# Patient Record
Sex: Female | Born: 1950 | Race: White | Hispanic: No | Smoking: Never smoker
Health system: Southern US, Community
[De-identification: ages and names within clinical notes are randomized; demographics above are authoritative.]

## PROBLEM LIST (undated history)

## (undated) DIAGNOSIS — M1711 Unilateral primary osteoarthritis, right knee: Secondary | ICD-10-CM

## (undated) DIAGNOSIS — E119 Type 2 diabetes mellitus without complications: Secondary | ICD-10-CM

## (undated) HISTORY — PX: KNEE SURGERY: SHX244

## (undated) HISTORY — PX: EYE SURGERY: SHX253

## (undated) HISTORY — DX: Type 2 diabetes mellitus without complications: E11.9

## (undated) HISTORY — PX: DILATION AND CURETTAGE OF UTERUS: SHX78

## (undated) HISTORY — PX: TONSILLECTOMY: SUR1361

---

## 2014-10-12 ENCOUNTER — Encounter: Payer: Self-pay | Admitting: Podiatry

## 2014-10-12 ENCOUNTER — Ambulatory Visit (INDEPENDENT_AMBULATORY_CARE_PROVIDER_SITE_OTHER): Payer: BLUE CROSS/BLUE SHIELD | Admitting: Podiatry

## 2014-10-12 ENCOUNTER — Ambulatory Visit (INDEPENDENT_AMBULATORY_CARE_PROVIDER_SITE_OTHER): Payer: BLUE CROSS/BLUE SHIELD

## 2014-10-12 VITALS — BP 121/73 | HR 83 | Resp 12

## 2014-10-12 DIAGNOSIS — R52 Pain, unspecified: Secondary | ICD-10-CM

## 2014-10-12 DIAGNOSIS — L03031 Cellulitis of right toe: Secondary | ICD-10-CM

## 2014-10-12 DIAGNOSIS — L02611 Cutaneous abscess of right foot: Secondary | ICD-10-CM | POA: Diagnosis not present

## 2014-10-12 NOTE — Progress Notes (Signed)
   Subjective:    Patient ID: Ephriam JenkinsSandra Shuman, female    DOB: [birth year], 64 y.o.   MRN: [id number]  HPI PT STATED RT FOOT 3RD TOE IS SWOLLEN, HAVE REDNESS, AND SHARP PAIN. THE TOE IS GETTING A LITTLE BETTER BUT GET WORSE WHEN SITTING/STANDING. URGENT CARE PRESCRIBE ANTIBIOTIC RX CEPHALEXIN  FOR 2 WEEKS AND IT HELP SOME.   Review of Systems  HENT: Positive for trouble swallowing.   Cardiovascular: Positive for leg swelling.  All other systems reviewed and are negative.      Objective:   Physical Exam        Assessment & Plan:

## 2015-03-11 DIAGNOSIS — R52 Pain, unspecified: Secondary | ICD-10-CM

## 2016-07-01 ENCOUNTER — Encounter (HOSPITAL_BASED_OUTPATIENT_CLINIC_OR_DEPARTMENT_OTHER): Payer: Self-pay | Admitting: Emergency Medicine

## 2016-07-01 ENCOUNTER — Emergency Department (HOSPITAL_BASED_OUTPATIENT_CLINIC_OR_DEPARTMENT_OTHER)
Admission: EM | Admit: 2016-07-01 | Discharge: 2016-07-01 | Disposition: A | Discharge status: Home / Self Care | Payer: Medicare Other | Attending: Emergency Medicine | Admitting: Emergency Medicine

## 2016-07-01 DIAGNOSIS — L551 Sunburn of second degree: Secondary | ICD-10-CM | POA: Insufficient documentation

## 2016-07-01 DIAGNOSIS — E119 Type 2 diabetes mellitus without complications: Secondary | ICD-10-CM | POA: Insufficient documentation

## 2016-07-01 MED ORDER — HYDROCODONE-ACETAMINOPHEN 5-325 MG PO TABS: 1.0000 | ORAL_TABLET | Freq: Four times a day (QID) | ORAL | 0 refills | Status: DC | PRN

## 2016-07-01 MED ORDER — NAPROXEN 250 MG PO TABS
500.0000 mg | ORAL_TABLET | Freq: Once | ORAL | Status: AC
  Administered 2016-07-01: 500 mg via ORAL
  Filled 2016-07-01: qty 2

## 2016-07-01 MED ORDER — NAPROXEN 500 MG PO TABS: 500.0000 mg | ORAL_TABLET | Freq: Two times a day (BID) | ORAL | 0 refills | Status: DC

## 2016-07-01 NOTE — ED Triage Notes (Signed)
Sunburn on both feet after going to beach

## 2016-07-01 NOTE — Discharge Instructions (Signed)
You were seen today for sunburn.  Use antiinflammatory medications.  If and when your blisters break, use antibiotic ointment to prevent secondary infection.

## 2016-07-01 NOTE — ED Provider Notes (Signed)
[facility] Provider Note   CSN: [id number] Arrival date & time: 07/01/16  2241  By signing my name below, I, Bing Neighbors., attest that this documentation has been prepared under the direction and in the presence of Shon Baton, MD. Electronically signed: Bing Neighbors., ED Scribe. 07/02/16. 12:11 AM.   History   Chief Complaint Chief Complaint  Patient presents with  . Sunburn    HPI Michele Frost is a 66 y.o. female with hx of Type 2 diabetes mellitus who presents to the Emergency Department complaining of sunburn with onset x3 days. Pt states that she drove from a vacation in [place] today. She states that x3 days ago she suffered sunburn while sitting on the beach. She has burns to the bilateral lower extremities and chest. Pt has tried Aloe lotion and burn spray with no relief. She has taken hydrocodone with mild relief. Reports pain is 8 out of 10. She denies any other complaints.   The history is provided by the patient. No language interpreter was used.    Past Medical History:  Diagnosis Date  . Diabetes mellitus without complication (HCC)     There are no active problems to display for this patient.   Past Surgical History:  Procedure Laterality Date  . HERNIA REPAIR    . KNEE SURGERY Right     OB History    No data available       Home Medications    Prior to Admission medications   Medication Sig Start Date End Date Taking? Authorizing Provider  HYDROcodone-acetaminophen (NORCO/VICODIN) 5-325 MG tablet Take 1 tablet by mouth every 6 (six) hours as needed. 07/01/16   Shon Baton, MD  naproxen (NAPROSYN) 500 MG tablet Take 1 tablet (500 mg total) by mouth 2 (two) times daily. Limit use to 3-5 days 07/01/16   Shon Baton, MD  NOVOLOG FLEXPEN 100 UNIT/ML FlexPen CHECK GLUCOSE <100 USE 0 UN, <150 = 4 UN, <200 = 8 UN, <250 = 12 UN, <300 = 15 UN, >300 = 18 UN 09/15/14   Historical Provider, MD    Family  History History reviewed. No pertinent family history.  Social History Social History  Substance Use Topics  . Smoking status: Never Smoker  . Smokeless tobacco: Never Used  . Alcohol use No     Allergies   Patient has no known allergies.   Review of Systems Review of Systems  Constitutional: Negative for chills and fever.  Respiratory: Negative for shortness of breath.   Gastrointestinal: Negative for nausea and vomiting.  Skin:       Sun burn to bilateral lower extremities and chest.  All other systems reviewed and are negative.    Physical Exam Updated Vital Signs BP (!) 177/92   Pulse (!) 110   Temp 98.5 F (36.9 C) (Oral)   Resp 18   Ht 5' 6.5" (1.689 m)   Wt 220 lb (99.8 kg)   SpO2 100%   BMI 34.98 kg/m   Physical Exam  Constitutional: She is oriented to person, place, and time. She appears well-developed and well-nourished. No distress.  Overweight  HENT:  Head: Normocephalic and atraumatic.  Cardiovascular: Normal rate, regular rhythm and normal heart sounds.   Pulmonary/Chest: Effort normal and breath sounds normal. No respiratory distress. She has no wheezes.  Neurological: She is alert and oriented to person, place, and time.  Skin: Skin is warm and dry.  Well-demarcated sunburn noted over the dorsum  of the bilateral feet extending over the lateral aspects of the calf, multiple intact blisters noted over the feet and toes, warmth noted with blanching erythema and scattered petechiae  Psychiatric: She has a normal mood and affect.  Nursing note and vitals reviewed.    ED Treatments / Results   DIAGNOSTIC STUDIES: Oxygen Saturation is 100% on RA, normal by my interpretation.   COORDINATION OF CARE: 12:11 AM-Discussed next steps with pt. Pt verbalized understanding and is agreeable with the plan.    Labs (all labs ordered are listed, but only abnormal results are displayed) Labs Reviewed - No data to display  EKG  EKG  Interpretation None       Radiology No results found.  Procedures Procedures (including critical care time)  Medications Ordered in ED Medications  naproxen (NAPROSYN) tablet 500 mg (500 mg Oral Given 07/01/16 2346)     Initial Impression / Assessment and Plan / ED Course  I have reviewed the triage vital signs and the nursing notes.  Pertinent labs & imaging results that were available during my care of the patient were reviewed by me and considered in my medical decision making (see chart for details).     Patient presents with pain and sunburn to the bilateral feet and legs. She is nontoxic-appearing. She appears to at least have second-degree burns given blistering. However, she may also have some element of sun poisoning given scattered petechiae. Reports that she tolerates NSAIDs. I have reviewed her outside charts. Most recent creatinine 1.1. Recommend naproxen twice a day and hydrocodone as needed for breakthrough pain. Continue aloe as needed. If and when blisters break, and antibiotic ointment to prevent infection.  After history, exam, and medical workup I feel the patient has been appropriately medically screened and is safe for discharge home. Pertinent diagnoses were discussed with the patient. Patient was given return precautions.   Final Clinical Impressions(s) / ED Diagnoses   Final diagnoses:  Sunburn of second degree    New Prescriptions New Prescriptions   HYDROCODONE-ACETAMINOPHEN (NORCO/VICODIN) 5-325 MG TABLET    Take 1 tablet by mouth every 6 (six) hours as needed.   NAPROXEN (NAPROSYN) 500 MG TABLET    Take 1 tablet (500 mg total) by mouth 2 (two) times daily. Limit use to 3-5 days   I personally performed the services described in this documentation, which was scribed in my presence. The recorded information has been reviewed and is accurate.     Shon Baton, MD 07/02/16 [phone]

## 2017-08-04 ENCOUNTER — Other Ambulatory Visit: Payer: Self-pay | Admitting: Orthopedic Surgery

## 2017-09-08 ENCOUNTER — Encounter (HOSPITAL_COMMUNITY): Payer: Self-pay

## 2017-09-08 ENCOUNTER — Other Ambulatory Visit: Payer: Self-pay

## 2017-09-08 ENCOUNTER — Encounter (HOSPITAL_COMMUNITY)
Admission: RE | Admit: 2017-09-08 | Discharge: 2017-09-08 | Disposition: A | Discharge status: Home / Self Care | Payer: Medicare Other | Source: Ambulatory Visit | Attending: Orthopedic Surgery | Admitting: Orthopedic Surgery

## 2017-09-08 DIAGNOSIS — Z01810 Encounter for preprocedural cardiovascular examination: Secondary | ICD-10-CM | POA: Insufficient documentation

## 2017-09-08 DIAGNOSIS — Z01812 Encounter for preprocedural laboratory examination: Secondary | ICD-10-CM | POA: Insufficient documentation

## 2017-09-08 HISTORY — DX: Unilateral primary osteoarthritis, right knee: M17.11

## 2017-09-08 LAB — GLUCOSE, CAPILLARY: GLUCOSE-CAPILLARY: 198 mg/dL — AB (ref 65–99)

## 2017-09-08 LAB — CBC WITH DIFFERENTIAL/PLATELET
Abs Immature Granulocytes: 0 10*3/uL (ref 0.0–0.1)
BASOS ABS: 0.1 10*3/uL (ref 0.0–0.1)
BASOS PCT: 1 %
EOS ABS: 0.3 10*3/uL (ref 0.0–0.7)
EOS PCT: 4 %
HCT: 38.5 % (ref 36.0–46.0)
Hemoglobin: 12.5 g/dL (ref 12.0–15.0)
Immature Granulocytes: 0 %
LYMPHS PCT: 23 %
Lymphs Abs: 1.7 10*3/uL (ref 0.7–4.0)
MCH: 28.7 pg (ref 26.0–34.0)
MCHC: 32.5 g/dL (ref 30.0–36.0)
MCV: 88.3 fL (ref 78.0–100.0)
MONO ABS: 0.5 10*3/uL (ref 0.1–1.0)
Monocytes Relative: 7 %
Neutro Abs: 4.6 10*3/uL (ref 1.7–7.7)
Neutrophils Relative %: 65 %
PLATELETS: 257 10*3/uL (ref 150–400)
RBC: 4.36 MIL/uL (ref 3.87–5.11)
RDW: 13 % (ref 11.5–15.5)
WBC: 7.2 10*3/uL (ref 4.0–10.5)

## 2017-09-08 LAB — BASIC METABOLIC PANEL
ANION GAP: 8 (ref 5–15)
BUN: 27 mg/dL — AB (ref 6–20)
CALCIUM: 9.9 mg/dL (ref 8.9–10.3)
CO2: 29 mmol/L (ref 22–32)
Chloride: 105 mmol/L (ref 101–111)
Creatinine, Ser: 1.18 mg/dL — ABNORMAL HIGH (ref 0.44–1.00)
GFR calc Af Amer: 54 mL/min — ABNORMAL LOW (ref >60)
GFR, EST NON AFRICAN AMERICAN: 47 mL/min — AB (ref >60)
GLUCOSE: 248 mg/dL — AB (ref 65–99)
Potassium: 4 mmol/L (ref 3.5–5.1)
Sodium: 142 mmol/L (ref 135–145)

## 2017-09-08 LAB — SURGICAL PCR SCREEN
MRSA, PCR: NEGATIVE
STAPHYLOCOCCUS AUREUS: NEGATIVE

## 2017-09-08 LAB — HEMOGLOBIN A1C
Hgb A1c MFr Bld: 9.4 % — ABNORMAL HIGH (ref 4.8–5.6)
Mean Plasma Glucose: 223.08 mg/dL

## 2017-09-08 NOTE — Progress Notes (Signed)
PCP - Dr. Aline AugustHolmes- W[facility]  Cardiologist - Denies  Endo- Dr. Vivien RossettiAlthimer- [facility]  Chest x-ray - 05/24/17 (CE)  EKG - 09/08/17  Stress Test - Denies  ECHO - Denies  Cardiac Cath - Denies  Sleep Study - Denies CPAP - None  LABS- 09/08/17: CBC w/D, BMP  ASA- Denies  HA1C- 09/08/17 Fasting Blood Sugar - 130, Today 198 Checks Blood Sugar ___1__ times a day  Pt's BP elevated due to her being nervous and anxious about her up-coming surgery. Pt advised to monitor her blood pressure and bs, and if any are abnormal the day of surgery, that her surgery could be cancelled.  Anesthesia- No  Pt denies having chest pain, sob, or fever at this time. All instructions explained to the pt, with a verbal understanding of the material. Pt agrees to go over the instructions while at home for a better understanding. The opportunity to ask questions was provided.

## 2017-09-08 NOTE — Pre-Procedure Instructions (Signed)
Nadyne CoombesSandra L Wehrly  09/08/2017      [facility] [phone] [place] - [address] AT [facility] [address] [place] [address] Phone: [phone] Fax: [phone]  [id number] [place] - [address] AT [facility] [address] [place] [address] Phone: [phone] Fax: [phone]    Your procedure is scheduled on Mon., September 20, 2017 from 7:30AM-8:45AM  [facility]" at 5:30AM  Call this number if you have problems the morning of surgery:  [phone]   Remember:  Do not eat or drink after midnight on June 23rd    Take these medicines the morning of surgery with A SIP OF WATER:  If needed HYDROcodone-acetaminophen (NORCO/VICODIN)  7 days before surgery (6/17), stop taking all Other Aspirin Products, Vitamins, Fish oils, and Herbal medications. Also stop all NSAIDS i.e. Advil, Ibuprofen, Motrin, Aleve, Anaprox, Naproxen, BC, Goody Powders, and all Supplements.  How to Manage Your Diabetes Before and After Surgery  Why is it important to control my blood sugar before and after surgery? . Improving blood sugar levels before and after surgery helps healing and can limit problems. . A way of improving blood sugar control is eating a healthy diet by: o  Eating less sugar and carbohydrates o  Increasing activity/exercise o  Talking with your doctor about reaching your blood sugar goals . High blood sugars (greater than 180 mg/dL) can raise your risk of infections and slow your recovery, so you will need to focus on controlling your diabetes during the weeks before surgery. . Make sure that the doctor who takes care of your diabetes knows about your planned surgery including the date and location.  How do I manage my blood sugar before surgery? . Check your blood sugar at least 4 times a day, starting 2 days before  surgery, to make sure that the level is not too high or low. o Check your blood sugar the morning of your surgery when you wake up and every 2 hours until you get to the Short Stay unit. . If your blood sugar is less than 70 mg/dL, you will need to treat for low blood sugar: o Do not take insulin. o Treat a low blood sugar (less than 70 mg/dL) with  cup of clear juice (cranberry or apple), 4 glucose tablets, OR glucose gel. Recheck blood sugar in 15 minutes after treatment (to make sure it is greater than 70 mg/dL). If your blood sugar is not greater than 70 mg/dL on recheck, call [phone] o  for further instructions. . Report your blood sugar to the short stay nurse when you get to Short Stay.  . If you are admitted to the hospital after surgery: o Your blood sugar will be checked by the staff and you will probably be given insulin after surgery (instead of oral diabetes medicines) to make sure you have good blood sugar levels. o The goal for blood sugar control after surgery is 80-180 mg/dL.  WHAT DO I DO ABOUT MY DIABETES MEDICATION?  The Morning of surgery Do Not Take Insulin injectables: Insulin Degludec (TRESIBA) liraglutide (VICTOZA)   Reviewed and Endorsed by [facility] Patient Education Committee, August 2015    Do not wear jewelry, make-up or nail polish (fingers).  Do not wear lotions, powders, or perfumes, or deodorant.  Do not  shave 48 hours prior to surgery.    Do not bring valuables to the hospital.  [facility] is not responsible for any belongings or valuables.  Contacts, dentures or bridgework may not be worn into surgery.  Leave your suitcase in the car.  After surgery it may be brought to your room.  For patients admitted to the hospital, discharge time will be determined by your treatment team.  Patients discharged the day of surgery will not be allowed to drive home.   Special instructions: [facility]- Preparing For Surgery  Before surgery, you can play  an important role. Because skin is not sterile, your skin needs to be as free of germs as possible. You can reduce the number of germs on your skin by washing with CHG (chlorahexidine gluconate) Soap before surgery.  CHG is an antiseptic cleaner which kills germs and bonds with the skin to continue killing germs even after washing.    Oral Hygiene is also important to reduce your risk of infection.  Remember - BRUSH YOUR TEETH THE MORNING OF SURGERY WITH YOUR REGULAR TOOTHPASTE  Please do not use if you have an allergy to CHG or antibacterial soaps. If your skin becomes reddened/irritated stop using the CHG.  Do not shave (including legs and underarms) for at least 48 hours prior to first CHG shower. It is OK to shave your face.  Please follow these instructions carefully.   1. Shower the NIGHT BEFORE SURGERY and the MORNING OF SURGERY with CHG.   2. If you chose to wash your hair, wash your hair first as usual with your normal shampoo.  3. After you shampoo, rinse your hair and body thoroughly to remove the shampoo.  4. Use CHG as you would any other liquid soap. You can apply CHG directly to the skin and wash gently with a scrungie or a clean washcloth.   5. Apply the CHG Soap to your body ONLY FROM THE NECK DOWN.  Do not use on open wounds or open sores. Avoid contact with your eyes, ears, mouth and genitals (private parts). Wash Face and genitals (private parts)  with your normal soap.  6. Wash thoroughly, paying special attention to the area where your surgery will be performed.  7. Thoroughly rinse your body with warm water from the neck down.  8. DO NOT shower/wash with your normal soap after using and rinsing off the CHG Soap.  9. Pat yourself dry with a CLEAN TOWEL.  10. Wear CLEAN PAJAMAS to bed the night before surgery, wear comfortable clothes the morning of surgery  11. Place CLEAN SHEETS on your bed the night of your first shower and DO NOT SLEEP WITH PETS.  Day of  Surgery:  Do not apply any deodorants/lotions.  Please wear clean clothes to the hospital/surgery center.   Remember to brush your teeth WITH YOUR REGULAR TOOTHPASTE.  Please read over the following fact sheets that you were given. Pain Booklet, Coughing and Deep Breathing, MRSA Information and Surgical Site Infection Prevention

## 2017-09-09 ENCOUNTER — Encounter (HOSPITAL_COMMUNITY): Payer: Self-pay | Admitting: Emergency Medicine

## 2017-09-09 ENCOUNTER — Encounter (HOSPITAL_COMMUNITY): Payer: Self-pay | Admitting: Anesthesiology

## 2017-09-09 NOTE — Progress Notes (Signed)
Anesthesia Chart Review:   Case:  [id number] Date/Time:  09/20/17 0715   Procedure:  TOTAL KNEE ARTHROPLASTY (Right )   Anesthesia type:  Spinal   Pre-op diagnosis:  primary osteoarthritis right knee   Location:  [facility] ROOM 06 / [facility]   Surgeon:  Dannielle HuhLucey, Steve, MD      DISCUSSION: - Pt is a 67 year old female with hx DM  - HbA1c was 9.4 at pre-admission testing.  I reached out to PCP about prudence of proceeding with surgery with uncontrolled DM.  Dr. Aline AugustHolmes' nurse Rinaldo CloudPamela spoke with Dr. Aline AugustHolmes who felt it is up to surgeon to decide whether or not to proceed, but did recommend pt f/u with endocrinology. This has not yet happened.  I notified Dr. Tobin ChadLucey's office of uncontrolled DM. In the meantime, someone (? PCP's office?) ordered recheck labs (see care everywhere).  Fructosamine was 360 on 09/14/17; this fructosamine level corresponds to a hbA1c between 7.5 and 8.     VS: BP (!) 162/92 Comment: taken manually notified Adrain RN  Pulse 94   Temp (!) 36.4 C   Resp 20   Ht 5' 6.5" (1.689 m)   Wt 233 lb 4.8 oz (105.8 kg)   SpO2 98%   BMI 37.09 kg/m    PROVIDERS: PCP is Aline AugustHolmes, Donnelly Angelicaionne Natalie, MD (notes in care everywhere)    LABS:  - HbA1c 9.4, glucose 248  (all labs ordered are listed, but only abnormal results are displayed)  Labs Reviewed  GLUCOSE, CAPILLARY - Abnormal; Notable for the following components:      Result Value   Glucose-Capillary 198 (*)    All other components within normal limits  BASIC METABOLIC PANEL - Abnormal; Notable for the following components:   Glucose, Bld 248 (*)    BUN 27 (*)    Creatinine, Ser 1.18 (*)    GFR calc non Af Amer 47 (*)    GFR calc Af Amer 54 (*)    All other components within normal limits  HEMOGLOBIN A1C - Abnormal; Notable for the following components:   Hgb A1c MFr Bld 9.4 (*)    All other components within normal limits  SURGICAL PCR SCREEN  CBC WITH DIFFERENTIAL/PLATELET    IMAGES:  CXR 05/24/17 (care everywhere): No  edema or consolidation.Question a degree of bowel ileus   EKG 09/08/17: Sinus rhythm with PACs   Past Medical History:  Diagnosis Date  . Diabetes mellitus without complication (HCC)    Type II  . Osteoarthritis of right knee    Severe    Past Surgical History:  Procedure Laterality Date  . DILATION AND CURETTAGE OF UTERUS    . EYE SURGERY     Bilateral Cataracts  . KNEE SURGERY Right   . TONSILLECTOMY      MEDICATIONS: . gabapentin (NEURONTIN) 300 MG capsule  . Insulin Degludec (TRESIBA) 100 UNIT/ML SOLN  . liraglutide (VICTOZA) 18 MG/3ML SOPN   No current facility-administered medications for this encounter.     If glucose acceptable day of surgery, I anticipate pt can proceed with surgery as scheduled.  Rica Mastngela Pasqualina Colasurdo, FNP-BC [facility] Phone: [phone] 09/16/2017 4:07 PM

## 2017-09-16 ENCOUNTER — Telehealth (HOSPITAL_COMMUNITY): Payer: Self-pay | Admitting: Emergency Medicine

## 2017-09-17 MED ORDER — TRANEXAMIC ACID 1000 MG/10ML IV SOLN
1000.0000 mg | INTRAVENOUS | Status: DC
  Filled 2017-09-17: qty 10

## 2017-09-17 MED ORDER — BUPIVACAINE LIPOSOME 1.3 % IJ SUSP
20.0000 mL | Freq: Once | INTRAMUSCULAR | Status: DC
  Filled 2017-09-17: qty 20

## 2017-09-19 NOTE — Anesthesia Preprocedure Evaluation (Deleted)
Anesthesia Evaluation    Reviewed: Allergy & Precautions, H&P , Patient's Chart, lab work & pertinent test results  Airway        Dental   Pulmonary neg pulmonary ROS,           Cardiovascular Exercise Tolerance: Good negative cardio ROS       Neuro/Psych negative neurological ROS  negative psych ROS   GI/Hepatic negative GI ROS, Neg liver ROS,   Endo/Other  negative endocrine ROSdiabetes, Insulin DependentMorbid obesity  Renal/GU negative Renal ROS  negative genitourinary   Musculoskeletal  (+) Arthritis , Osteoarthritis,    Abdominal   Peds  Hematology negative hematology ROS (+)   Anesthesia Other Findings   Reproductive/Obstetrics negative OB ROS                             Anesthesia Physical Anesthesia Plan  ASA: III  Anesthesia Plan: Spinal   Post-op Pain Management:  Regional for Post-op pain   Induction: Intravenous  PONV Risk Score and Plan: 3 and Ondansetron, Midazolam and Propofol infusion  Airway Management Planned: Simple Face Mask  Additional Equipment:   Intra-op Plan:   Post-operative Plan:   Informed Consent: I have reviewed the patients History and Physical, chart, labs and discussed the procedure including the risks, benefits and alternatives for the proposed anesthesia with the patient or authorized representative who has indicated his/her understanding and acceptance.   Dental advisory given  Plan Discussed with: CRNA  Anesthesia Plan Comments:         Anesthesia Quick Evaluation

## 2017-09-20 ENCOUNTER — Encounter (HOSPITAL_COMMUNITY): Admission: RE | Payer: Self-pay | Source: Ambulatory Visit

## 2017-09-20 ENCOUNTER — Ambulatory Visit (HOSPITAL_COMMUNITY): Admission: RE | Admit: 2017-09-20 | Payer: Medicare Other | Source: Ambulatory Visit | Admitting: Orthopedic Surgery

## 2017-09-20 SURGERY — ARTHROPLASTY, KNEE, TOTAL
Anesthesia: Spinal | Laterality: Right

## 2017-11-15 ENCOUNTER — Other Ambulatory Visit: Payer: Self-pay | Admitting: Orthopedic Surgery

## 2018-01-11 ENCOUNTER — Other Ambulatory Visit (HOSPITAL_COMMUNITY): Payer: Medicare Other

## 2018-01-17 ENCOUNTER — Ambulatory Visit: Admit: 2018-01-17 | Payer: Medicare Other | Admitting: Orthopedic Surgery

## 2018-01-17 SURGERY — ARTHROPLASTY, KNEE, TOTAL
Anesthesia: Spinal | Site: Knee | Laterality: Right

## 2018-02-28 ENCOUNTER — Other Ambulatory Visit: Payer: Self-pay | Admitting: Orthopedic Surgery

## 2018-03-17 NOTE — Progress Notes (Signed)
02/28/2018- Office note on chart from Dr. Casimiro NeedleMichael Altheimer, Endrocrinologist  02/23/2018-on chart, labs-U/A micro, Urine Albumin, CMP, HgA1C  09/08/2017- noted in Epic-EKG

## 2018-03-17 NOTE — Patient Instructions (Addendum)
Michele CoombesSandra L Frost  03/17/2018   Your procedure is scheduled on: Monday 04/04/2018  [facility]              Report to admitting at  [phone]  AM    Call this number if you have problems the morning of surgery [phone]    How to Manage Your Diabetes Before and After Surgery  Why is it important to control my blood sugar before and after surgery? . Improving blood sugar levels before and after surgery helps healing and can limit problems. . A way of improving blood sugar control is eating a healthy diet by: o  Eating less sugar and carbohydrates o  Increasing activity/exercise o  Talking with your doctor about reaching your blood sugar goals . High blood sugars (greater than 180 mg/dL) can raise your risk of infections and slow your recovery, so you will need to focus on controlling your diabetes during the weeks before surgery. . Make sure that the doctor who takes care of your diabetes knows about your planned surgery including the date and location.  How do I manage my blood sugar before surgery? . Check your blood sugar at least 4 times a day, starting 2 days before surgery, to make sure that the level is not too high or low. o Check your blood sugar the morning of your surgery when you wake up and every 2 hours until you get to the Short Stay unit. . If your blood sugar is less than 70 mg/dL, you will need to treat for low blood sugar: o Do not take insulin. o Treat a low blood sugar (less than 70 mg/dL) with  cup of clear juice (cranberry or apple), 4 glucose tablets, OR glucose gel. o Recheck blood sugar in 15 minutes after treatment (to make sure it is greater than 70 mg/dL). If your blood sugar is not greater than 70 mg/dL on recheck, call [phone] for further instructions. . Report your blood sugar to the short stay nurse when you get to Short Stay.  . If you are admitted to the hospital after surgery: o Your blood sugar  will be checked by the staff and you will probably be given insulin after surgery (instead of oral diabetes medicines) to make sure you have good blood sugar levels. o The goal for blood sugar control after surgery is 80-180 mg/dL.   WHAT DO I DO ABOUT MY DIABETES MEDICATION?       The day before surgery, Take your Evaristo Buryresiba as usual.        The day before surgery, TAKE YOUR HUMALOG SLIDING SCALE INSULIN AS USUAL.  . Do not take oral diabetes medicines (pills) the morning of surgery.     . THE MORNING OF SURGERY, take    10 units of     Tresiba    insulin.  . The day of surgery, do not take other diabetes injectables, including Byetta (exenatide), Bydureon (exenatide ER), Victoza (liraglutide), or Trulicity (dulaglutide).  . If your CBG is greater than 220 mg/dL, you may take  of your sliding scale  . (correction) dose of insulin.           Remember: Do not eat food or drink liquids :After Midnight.               BRUSH YOUR TEETH MORNING OF SURGERY AND RINSE YOUR MOUTH  OUT, NO CHEWING GUM CANDY OR MINTS.     Take these medicines the morning of surgery with A SIP OF WATER: none               DO NOT TAKE ANY DIABETIC MEDICATIONS DAY OF YOUR SURGERY                               You may not have any metal on your body including hair pins and              piercings  Do not wear jewelry, make-up, lotions, powders or perfumes, deodorant             Do not wear nail polish.  Do not shave  48 hours prior to surgery.              Do not bring valuables to the hospital. [facility] IS NOT             RESPONSIBLE   FOR VALUABLES.  Contacts, dentures or bridgework may not be worn into surgery.  Leave suitcase in the car. After surgery it may be brought to your room.                  Please read over the following fact sheets you were given: _____________________________________________________________________             [facility] - Preparing for Surgery Before surgery, you  can play an important role.  Because skin is not sterile, your skin needs to be as free of germs as possible.  You can reduce the number of germs on your skin by washing with CHG (chlorahexidine gluconate) soap before surgery.  CHG is an antiseptic cleaner which kills germs and bonds with the skin to continue killing germs even after washing. Please DO NOT use if you have an allergy to CHG or antibacterial soaps.  If your skin becomes reddened/irritated stop using the CHG and inform your nurse when you arrive at Short Stay. Do not shave (including legs and underarms) for at least 48 hours prior to the first CHG shower.  You may shave your face/neck. Please follow these instructions carefully:  1.  Shower with CHG Soap the night before surgery and the  morning of Surgery.  2.  If you choose to wash your hair, wash your hair first as usual with your  normal  shampoo.  3.  After you shampoo, rinse your hair and body thoroughly to remove the  shampoo.                           4.  Use CHG as you would any other liquid soap.  You can apply chg directly  to the skin and wash                       Gently with a scrungie or clean washcloth.  5.  Apply the CHG Soap to your body ONLY FROM THE NECK DOWN.   Do not use on face/ open                           Wound or open sores. Avoid contact with eyes, ears mouth and genitals (private parts).  Wash face,  Genitals (private parts) with your normal soap.             6.  Wash thoroughly, paying special attention to the area where your surgery  will be performed.  7.  Thoroughly rinse your body with warm water from the neck down.  8.  DO NOT shower/wash with your normal soap after using and rinsing off  the CHG Soap.                9.  Pat yourself dry with a clean towel.            10.  Wear clean pajamas.            11.  Place clean sheets on your bed the night of your first shower and do not  sleep with pets. Day of Surgery : Do not apply  any lotions/deodorants the morning of surgery.  Please wear clean clothes to the hospital/surgery center.  FAILURE TO FOLLOW THESE INSTRUCTIONS MAY RESULT IN THE CANCELLATION OF YOUR SURGERY PATIENT SIGNATURE_________________________________  NURSE SIGNATURE__________________________________  ________________________________________________________________________   Rogelia MireIncentive Spirometer  An incentive spirometer is a tool that can help keep your lungs clear and active. This tool measures how well you are filling your lungs with each breath. Taking long deep breaths may help reverse or decrease the chance of developing breathing (pulmonary) problems (especially infection) following:  A long period of time when you are unable to move or be active. BEFORE THE PROCEDURE   If the spirometer includes an indicator to show your best effort, your nurse or respiratory therapist will set it to a desired goal.  If possible, sit up straight or lean slightly forward. Try not to slouch.  Hold the incentive spirometer in an upright position. INSTRUCTIONS FOR USE  1. Sit on the edge of your bed if possible, or sit up as far as you can in bed or on a chair. 2. Hold the incentive spirometer in an upright position. 3. Breathe out normally. 4. Place the mouthpiece in your mouth and seal your lips tightly around it. 5. Breathe in slowly and as deeply as possible, raising the piston or the ball toward the top of the column. 6. Hold your breath for 3-5 seconds or for as long as possible. Allow the piston or ball to fall to the bottom of the column. 7. Remove the mouthpiece from your mouth and breathe out normally. 8. Rest for a few seconds and repeat Steps 1 through 7 at least 10 times every 1-2 hours when you are awake. Take your time and take a few normal breaths between deep breaths. 9. The spirometer may include an indicator to show your best effort. Use the indicator as a goal to work toward during each  repetition. 10. After each set of 10 deep breaths, practice coughing to be sure your lungs are clear. If you have an incision (the cut made at the time of surgery), support your incision when coughing by placing a pillow or rolled up towels firmly against it. Once you are able to get out of bed, walk around indoors and cough well. You may stop using the incentive spirometer when instructed by your caregiver.  RISKS AND COMPLICATIONS  Take your time so you do not get dizzy or light-headed.  If you are in pain, you may need to take or ask for pain medication before doing incentive spirometry. It is harder to take a deep breath if you are having  pain. AFTER USE  Rest and breathe slowly and easily.  It can be helpful to keep track of a log of your progress. Your caregiver can provide you with a simple table to help with this. If you are using the spirometer at home, follow these instructions: [place] IF:   You are having difficultly using the spirometer.  You have trouble using the spirometer as often as instructed.  Your pain medication is not giving enough relief while using the spirometer.  You develop fever of 100.5 F (38.1 C) or higher. SEEK IMMEDIATE MEDICAL CARE IF:   You cough up bloody sputum that had not been present before.  You develop fever of 102 F (38.9 C) or greater.  You develop worsening pain at or near the incision site. MAKE SURE YOU:   Understand these instructions.  Will watch your condition.  Will get help right away if you are not doing well or get worse. Document Released: 07/27/2006 Document Revised: 06/08/2011 Document Reviewed: 09/27/2006 [facility] Patient Information 2014 [place], [place].   ________________________________________________________________________

## 2018-03-21 ENCOUNTER — Encounter (HOSPITAL_COMMUNITY)
Admission: RE | Admit: 2018-03-21 | Discharge: 2018-03-21 | Disposition: A | Discharge status: Home / Self Care | Payer: Medicare Other | Source: Ambulatory Visit | Attending: Orthopedic Surgery | Admitting: Orthopedic Surgery

## 2018-03-21 ENCOUNTER — Other Ambulatory Visit: Payer: Self-pay

## 2018-03-21 ENCOUNTER — Encounter (HOSPITAL_COMMUNITY): Payer: Self-pay

## 2018-03-21 DIAGNOSIS — Z01812 Encounter for preprocedural laboratory examination: Secondary | ICD-10-CM | POA: Insufficient documentation

## 2018-03-21 LAB — SURGICAL PCR SCREEN
MRSA, PCR: NEGATIVE
Staphylococcus aureus: NEGATIVE

## 2018-03-21 LAB — CBC WITH DIFFERENTIAL/PLATELET
Abs Immature Granulocytes: 0.02 10*3/uL (ref 0.00–0.07)
Basophils Absolute: 0.1 10*3/uL (ref 0.0–0.1)
Basophils Relative: 1 %
Eosinophils Absolute: 0.3 10*3/uL (ref 0.0–0.5)
Eosinophils Relative: 5 %
HCT: 39.5 % (ref 36.0–46.0)
Hemoglobin: 12.4 g/dL (ref 12.0–15.0)
Immature Granulocytes: 0 %
Lymphocytes Relative: 29 %
Lymphs Abs: 2.1 10*3/uL (ref 0.7–4.0)
MCH: 28.4 pg (ref 26.0–34.0)
MCHC: 31.4 g/dL (ref 30.0–36.0)
MCV: 90.6 fL (ref 80.0–100.0)
Monocytes Absolute: 0.6 10*3/uL (ref 0.1–1.0)
Monocytes Relative: 9 %
NEUTROS ABS: 4.1 10*3/uL (ref 1.7–7.7)
Neutrophils Relative %: 56 %
Platelets: 283 10*3/uL (ref 150–400)
RBC: 4.36 MIL/uL (ref 3.87–5.11)
RDW: 12.8 % (ref 11.5–15.5)
WBC: 7.2 10*3/uL (ref 4.0–10.5)
nRBC: 0 % (ref 0.0–0.2)

## 2018-03-21 LAB — GLUCOSE, CAPILLARY: GLUCOSE-CAPILLARY: 114 mg/dL — AB (ref 70–99)

## 2018-04-03 MED ORDER — BUPIVACAINE LIPOSOME 1.3 % IJ SUSP
20.0000 mL | Freq: Once | INTRAMUSCULAR | Status: DC
  Filled 2018-04-03: qty 20

## 2018-04-04 ENCOUNTER — Ambulatory Visit (HOSPITAL_COMMUNITY): Payer: Medicare (Managed Care) | Admitting: Registered Nurse

## 2018-04-04 ENCOUNTER — Observation Stay (HOSPITAL_COMMUNITY)
Admission: RE | Admit: 2018-04-04 | Discharge: 2018-04-06 | Disposition: A | Discharge status: Home / Self Care | Payer: Medicare (Managed Care) | Attending: Orthopedic Surgery | Admitting: Orthopedic Surgery

## 2018-04-04 ENCOUNTER — Other Ambulatory Visit: Payer: Self-pay

## 2018-04-04 ENCOUNTER — Encounter (HOSPITAL_COMMUNITY): Payer: Self-pay | Admitting: Emergency Medicine

## 2018-04-04 ENCOUNTER — Encounter (HOSPITAL_COMMUNITY)
Admission: RE | Disposition: A | Discharge status: Home / Self Care | Payer: Self-pay | Source: Home / Self Care | Attending: Orthopedic Surgery

## 2018-04-04 DIAGNOSIS — E119 Type 2 diabetes mellitus without complications: Secondary | ICD-10-CM | POA: Insufficient documentation

## 2018-04-04 DIAGNOSIS — Z794 Long term (current) use of insulin: Secondary | ICD-10-CM | POA: Insufficient documentation

## 2018-04-04 DIAGNOSIS — M1711 Unilateral primary osteoarthritis, right knee: Principal | ICD-10-CM | POA: Insufficient documentation

## 2018-04-04 DIAGNOSIS — Z7982 Long term (current) use of aspirin: Secondary | ICD-10-CM | POA: Insufficient documentation

## 2018-04-04 DIAGNOSIS — D649 Anemia, unspecified: Secondary | ICD-10-CM | POA: Diagnosis not present

## 2018-04-04 DIAGNOSIS — Z96659 Presence of unspecified artificial knee joint: Secondary | ICD-10-CM

## 2018-04-04 DIAGNOSIS — Z791 Long term (current) use of non-steroidal anti-inflammatories (NSAID): Secondary | ICD-10-CM | POA: Diagnosis not present

## 2018-04-04 DIAGNOSIS — I2510 Atherosclerotic heart disease of native coronary artery without angina pectoris: Secondary | ICD-10-CM | POA: Insufficient documentation

## 2018-04-04 DIAGNOSIS — Z79899 Other long term (current) drug therapy: Secondary | ICD-10-CM | POA: Insufficient documentation

## 2018-04-04 HISTORY — PX: TOTAL KNEE ARTHROPLASTY: SHX125

## 2018-04-04 LAB — GLUCOSE, CAPILLARY
GLUCOSE-CAPILLARY: 357 mg/dL — AB (ref 70–99)
Glucose-Capillary: 71 mg/dL (ref 70–99)
Glucose-Capillary: 76 mg/dL (ref 70–99)
Glucose-Capillary: 82 mg/dL (ref 70–99)
Glucose-Capillary: 205 mg/dL — ABNORMAL HIGH (ref 70–99)

## 2018-04-04 SURGERY — ARTHROPLASTY, KNEE, TOTAL
Anesthesia: Regional | Laterality: Right

## 2018-04-04 MED ORDER — ACETAMINOPHEN 500 MG PO TABS
1000.0000 mg | ORAL_TABLET | Freq: Four times a day (QID) | ORAL | Status: AC
  Administered 2018-04-04 – 2018-04-05 (×3): 1000 mg via ORAL
  Filled 2018-04-04 (×4): qty 2

## 2018-04-04 MED ORDER — TRAMADOL HCL 50 MG PO TABS
50.0000 mg | ORAL_TABLET | Freq: Four times a day (QID) | ORAL | Status: DC
  Administered 2018-04-04 – 2018-04-06 (×9): 50 mg via ORAL
  Filled 2018-04-04 (×9): qty 1

## 2018-04-04 MED ORDER — BUPIVACAINE-EPINEPHRINE (PF) 0.25% -1:200000 IJ SOLN
INTRAMUSCULAR | Status: DC | PRN
  Administered 2018-04-04: 30 mL via PERINEURAL

## 2018-04-04 MED ORDER — METOCLOPRAMIDE HCL 5 MG PO TABS: 5.0000 mg | ORAL_TABLET | Freq: Three times a day (TID) | ORAL | Status: DC | PRN

## 2018-04-04 MED ORDER — PHENOL 1.4 % MT LIQD: 1.0000 | OROMUCOSAL | Status: DC | PRN

## 2018-04-04 MED ORDER — ONDANSETRON HCL 4 MG/2ML IJ SOLN: 4.0000 mg | Freq: Once | INTRAMUSCULAR | Status: DC | PRN

## 2018-04-04 MED ORDER — CHLORHEXIDINE GLUCONATE 4 % EX LIQD: 60.0000 mL | Freq: Once | CUTANEOUS | Status: DC

## 2018-04-04 MED ORDER — ASPIRIN EC 325 MG PO TBEC
325.0000 mg | DELAYED_RELEASE_TABLET | Freq: Two times a day (BID) | ORAL | Status: DC
  Administered 2018-04-05 – 2018-04-06 (×3): 325 mg via ORAL
  Filled 2018-04-04 (×3): qty 1

## 2018-04-04 MED ORDER — ONDANSETRON HCL 4 MG/2ML IJ SOLN
INTRAMUSCULAR | Status: AC
  Filled 2018-04-04: qty 2

## 2018-04-04 MED ORDER — CEFAZOLIN SODIUM-DEXTROSE 2-4 GM/100ML-% IV SOLN
2.0000 g | Freq: Four times a day (QID) | INTRAVENOUS | Status: AC
  Administered 2018-04-04 (×2): 2 g via INTRAVENOUS
  Filled 2018-04-04 (×2): qty 100

## 2018-04-04 MED ORDER — SODIUM CHLORIDE 0.9% FLUSH
INTRAVENOUS | Status: DC | PRN
  Administered 2018-04-04: 20 mL

## 2018-04-04 MED ORDER — INSULIN LISPRO (1 UNIT DIAL) 100 UNIT/ML (KWIKPEN): 1.0000 [IU] | PEN_INJECTOR | Freq: Three times a day (TID) | SUBCUTANEOUS | Status: DC

## 2018-04-04 MED ORDER — OXYCODONE HCL 5 MG PO TABS
5.0000 mg | ORAL_TABLET | ORAL | Status: DC | PRN
  Administered 2018-04-04 – 2018-04-05 (×2): 5 mg via ORAL
  Administered 2018-04-06: 10 mg via ORAL
  Filled 2018-04-04 (×5): qty 1

## 2018-04-04 MED ORDER — FERROUS SULFATE 325 (65 FE) MG PO TABS
325.0000 mg | ORAL_TABLET | Freq: Three times a day (TID) | ORAL | Status: DC
  Administered 2018-04-04 – 2018-04-06 (×4): 325 mg via ORAL
  Filled 2018-04-04 (×4): qty 1

## 2018-04-04 MED ORDER — ACETAMINOPHEN 500 MG PO TABS
1000.0000 mg | ORAL_TABLET | Freq: Once | ORAL | Status: AC
  Administered 2018-04-04: 1000 mg via ORAL
  Filled 2018-04-04: qty 2

## 2018-04-04 MED ORDER — ALUM & MAG HYDROXIDE-SIMETH 200-200-20 MG/5ML PO SUSP: 30.0000 mL | ORAL | Status: DC | PRN

## 2018-04-04 MED ORDER — FENTANYL CITRATE (PF) 100 MCG/2ML IJ SOLN: 25.0000 ug | INTRAMUSCULAR | Status: DC | PRN

## 2018-04-04 MED ORDER — INSULIN GLARGINE 100 UNIT/ML ~~LOC~~ SOLN
20.0000 [IU] | Freq: Every day | SUBCUTANEOUS | Status: DC
  Administered 2018-04-05 – 2018-04-06 (×2): 20 [IU] via SUBCUTANEOUS
  Filled 2018-04-04 (×2): qty 0.2

## 2018-04-04 MED ORDER — INSULIN DEGLUDEC 100 UNIT/ML ~~LOC~~ SOLN: 20.0000 [IU] | SUBCUTANEOUS | Status: DC

## 2018-04-04 MED ORDER — MIDAZOLAM HCL 2 MG/2ML IJ SOLN
1.0000 mg | INTRAMUSCULAR | Status: DC
  Administered 2018-04-04: 1 mg via INTRAVENOUS
  Filled 2018-04-04: qty 2

## 2018-04-04 MED ORDER — FENTANYL CITRATE (PF) 100 MCG/2ML IJ SOLN
50.0000 ug | INTRAMUSCULAR | Status: DC
  Filled 2018-04-04: qty 2

## 2018-04-04 MED ORDER — GABAPENTIN 300 MG PO CAPS
300.0000 mg | ORAL_CAPSULE | Freq: Three times a day (TID) | ORAL | Status: DC
  Administered 2018-04-04 – 2018-04-06 (×6): 300 mg via ORAL
  Filled 2018-04-04 (×6): qty 1

## 2018-04-04 MED ORDER — GABAPENTIN 300 MG PO CAPS
300.0000 mg | ORAL_CAPSULE | Freq: Once | ORAL | Status: AC
  Administered 2018-04-04: 300 mg via ORAL
  Filled 2018-04-04: qty 1

## 2018-04-04 MED ORDER — ZOLPIDEM TARTRATE 5 MG PO TABS: 5.0000 mg | ORAL_TABLET | Freq: Every evening | ORAL | Status: DC | PRN

## 2018-04-04 MED ORDER — LIRAGLUTIDE 18 MG/3ML ~~LOC~~ SOPN: 1.8000 mg | PEN_INJECTOR | Freq: Every day | SUBCUTANEOUS | Status: DC

## 2018-04-04 MED ORDER — METHOCARBAMOL 500 MG PO TABS
500.0000 mg | ORAL_TABLET | Freq: Four times a day (QID) | ORAL | Status: DC | PRN
  Administered 2018-04-04 – 2018-04-06 (×4): 500 mg via ORAL
  Filled 2018-04-04 (×4): qty 1

## 2018-04-04 MED ORDER — ONDANSETRON HCL 4 MG PO TABS: 4.0000 mg | ORAL_TABLET | Freq: Four times a day (QID) | ORAL | Status: DC | PRN

## 2018-04-04 MED ORDER — ONDANSETRON HCL 4 MG/2ML IJ SOLN
INTRAMUSCULAR | Status: DC | PRN
  Administered 2018-04-04: 4 mg via INTRAVENOUS

## 2018-04-04 MED ORDER — INSULIN ASPART 100 UNIT/ML ~~LOC~~ SOLN
0.0000 [IU] | Freq: Three times a day (TID) | SUBCUTANEOUS | Status: DC
  Administered 2018-04-04: 9 [IU] via SUBCUTANEOUS
  Administered 2018-04-05: 2 [IU] via SUBCUTANEOUS
  Administered 2018-04-05: 1 [IU] via SUBCUTANEOUS
  Administered 2018-04-06: 2 [IU] via SUBCUTANEOUS

## 2018-04-04 MED ORDER — FLEET ENEMA 7-19 GM/118ML RE ENEM: 1.0000 | ENEMA | Freq: Once | RECTAL | Status: DC | PRN

## 2018-04-04 MED ORDER — METHOCARBAMOL 500 MG IVPB - SIMPLE MED
500.0000 mg | Freq: Four times a day (QID) | INTRAVENOUS | Status: DC | PRN
  Filled 2018-04-04: qty 50

## 2018-04-04 MED ORDER — SODIUM CHLORIDE 0.9 % IV SOLN
INTRAVENOUS | Status: DC
  Administered 2018-04-04 – 2018-04-05 (×2): via INTRAVENOUS

## 2018-04-04 MED ORDER — HYDROMORPHONE HCL 1 MG/ML IJ SOLN: 0.5000 mg | INTRAMUSCULAR | Status: DC | PRN

## 2018-04-04 MED ORDER — 0.9 % SODIUM CHLORIDE (POUR BTL) OPTIME
TOPICAL | Status: DC | PRN
  Administered 2018-04-04: 1000 mL

## 2018-04-04 MED ORDER — DEXAMETHASONE SODIUM PHOSPHATE 10 MG/ML IJ SOLN
10.0000 mg | Freq: Once | INTRAMUSCULAR | Status: AC
  Administered 2018-04-05: 10 mg via INTRAVENOUS
  Filled 2018-04-04: qty 1

## 2018-04-04 MED ORDER — DOCUSATE SODIUM 100 MG PO CAPS
100.0000 mg | ORAL_CAPSULE | Freq: Two times a day (BID) | ORAL | Status: DC
  Administered 2018-04-04 – 2018-04-06 (×3): 100 mg via ORAL
  Filled 2018-04-04 (×3): qty 1

## 2018-04-04 MED ORDER — DEXAMETHASONE SODIUM PHOSPHATE 10 MG/ML IJ SOLN
8.0000 mg | Freq: Once | INTRAMUSCULAR | Status: AC
  Administered 2018-04-04: 8 mg via INTRAVENOUS

## 2018-04-04 MED ORDER — SENNOSIDES-DOCUSATE SODIUM 8.6-50 MG PO TABS: 1.0000 | ORAL_TABLET | Freq: Every evening | ORAL | Status: DC | PRN

## 2018-04-04 MED ORDER — TRANEXAMIC ACID-NACL 1000-0.7 MG/100ML-% IV SOLN
1000.0000 mg | Freq: Once | INTRAVENOUS | Status: AC
  Administered 2018-04-04: 1000 mg via INTRAVENOUS
  Filled 2018-04-04: qty 100

## 2018-04-04 MED ORDER — LACTATED RINGERS IV SOLN
INTRAVENOUS | Status: DC
  Administered 2018-04-04: 08:00:00 via INTRAVENOUS

## 2018-04-04 MED ORDER — MENTHOL 3 MG MT LOZG: 1.0000 | LOZENGE | OROMUCOSAL | Status: DC | PRN

## 2018-04-04 MED ORDER — TRANEXAMIC ACID-NACL 1000-0.7 MG/100ML-% IV SOLN
1000.0000 mg | INTRAVENOUS | Status: AC
  Administered 2018-04-04: 1000 mg via INTRAVENOUS
  Filled 2018-04-04: qty 100

## 2018-04-04 MED ORDER — FAMOTIDINE 20 MG PO TABS
ORAL_TABLET | ORAL | Status: AC
  Filled 2018-04-04: qty 1

## 2018-04-04 MED ORDER — LIRAGLUTIDE 18 MG/3ML ~~LOC~~ SOPN
1.8000 mg | PEN_INJECTOR | Freq: Every day | SUBCUTANEOUS | Status: DC
  Administered 2018-04-04 – 2018-04-06 (×3): 1.8 mg via SUBCUTANEOUS

## 2018-04-04 MED ORDER — PROPOFOL 10 MG/ML IV BOLUS
INTRAVENOUS | Status: AC
  Filled 2018-04-04: qty 40

## 2018-04-04 MED ORDER — METOPROLOL TARTRATE 5 MG/5ML IV SOLN: 10.0000 mg | Freq: Once | INTRAVENOUS | Status: DC

## 2018-04-04 MED ORDER — BUPIVACAINE IN DEXTROSE 0.75-8.25 % IT SOLN
INTRATHECAL | Status: DC | PRN
  Administered 2018-04-04: 1.6 mL via INTRATHECAL

## 2018-04-04 MED ORDER — ONDANSETRON HCL 4 MG/2ML IJ SOLN: 4.0000 mg | Freq: Four times a day (QID) | INTRAMUSCULAR | Status: DC | PRN

## 2018-04-04 MED ORDER — DEXAMETHASONE SODIUM PHOSPHATE 10 MG/ML IJ SOLN
INTRAMUSCULAR | Status: AC
  Filled 2018-04-04: qty 1

## 2018-04-04 MED ORDER — PROPOFOL 500 MG/50ML IV EMUL
INTRAVENOUS | Status: DC | PRN
  Administered 2018-04-04: 40 ug/kg/min via INTRAVENOUS

## 2018-04-04 MED ORDER — BUPIVACAINE-EPINEPHRINE (PF) 0.25% -1:200000 IJ SOLN
INTRAMUSCULAR | Status: AC
  Filled 2018-04-04: qty 30

## 2018-04-04 MED ORDER — DIPHENHYDRAMINE HCL 12.5 MG/5ML PO ELIX: 12.5000 mg | ORAL_SOLUTION | ORAL | Status: DC | PRN

## 2018-04-04 MED ORDER — CEFAZOLIN SODIUM-DEXTROSE 2-4 GM/100ML-% IV SOLN
2.0000 g | INTRAVENOUS | Status: AC
  Administered 2018-04-04: 2 g via INTRAVENOUS
  Filled 2018-04-04: qty 100

## 2018-04-04 MED ORDER — STERILE WATER FOR IRRIGATION IR SOLN
Status: DC | PRN
  Administered 2018-04-04: 2000 mL

## 2018-04-04 MED ORDER — GABAPENTIN 300 MG PO CAPS: 300.0000 mg | ORAL_CAPSULE | Freq: Every day | ORAL | Status: DC

## 2018-04-04 MED ORDER — PANTOPRAZOLE SODIUM 40 MG PO TBEC
40.0000 mg | DELAYED_RELEASE_TABLET | Freq: Every day | ORAL | Status: DC
  Administered 2018-04-04 – 2018-04-06 (×3): 40 mg via ORAL
  Filled 2018-04-04 (×3): qty 1

## 2018-04-04 MED ORDER — BISACODYL 5 MG PO TBEC: 5.0000 mg | DELAYED_RELEASE_TABLET | Freq: Every day | ORAL | Status: DC | PRN

## 2018-04-04 MED ORDER — ROPIVACAINE HCL 5 MG/ML IJ SOLN
INTRAMUSCULAR | Status: DC | PRN
  Administered 2018-04-04: 30 mL via PERINEURAL

## 2018-04-04 MED ORDER — METOCLOPRAMIDE HCL 5 MG/ML IJ SOLN: 5.0000 mg | Freq: Three times a day (TID) | INTRAMUSCULAR | Status: DC | PRN

## 2018-04-04 MED ORDER — FAMOTIDINE 20 MG PO TABS
20.0000 mg | ORAL_TABLET | Freq: Once | ORAL | Status: AC
  Administered 2018-04-04: 20 mg via ORAL
  Filled 2018-04-04: qty 1

## 2018-04-04 MED ORDER — SODIUM CHLORIDE 0.9 % IR SOLN
Status: DC | PRN
  Administered 2018-04-04: 1000 mL

## 2018-04-04 MED ORDER — BUPIVACAINE LIPOSOME 1.3 % IJ SUSP
INTRAMUSCULAR | Status: DC | PRN
  Administered 2018-04-04: 20 mL

## 2018-04-04 MED ORDER — SODIUM CHLORIDE (PF) 0.9 % IJ SOLN
INTRAMUSCULAR | Status: AC
  Filled 2018-04-04: qty 20

## 2018-04-04 SURGICAL SUPPLY — 58 items
ARTISURF 10M PLY R 6-9EF KNEE (Knees) ×2 IMPLANT
BAG ZIPLOCK 12X15 (MISCELLANEOUS) ×3 IMPLANT
BANDAGE ACE 6X5 VEL STRL LF (GAUZE/BANDAGES/DRESSINGS) ×3 IMPLANT
BANDAGE ELASTIC 6 VELCRO ST LF (GAUZE/BANDAGES/DRESSINGS) ×2 IMPLANT
BLADE SAGITTAL 13X1.27X60 (BLADE) ×2 IMPLANT
BLADE SAGITTAL 13X1.27X60MM (BLADE) ×1
BLADE SAW SGTL 83.5X18.5 (BLADE) ×3 IMPLANT
BLADE SURG 15 STRL LF DISP TIS (BLADE) ×1 IMPLANT
BLADE SURG 15 STRL SS (BLADE) ×2
BLADE SURG SZ10 CARB STEEL (BLADE) ×6 IMPLANT
BOWL SMART MIX CTS (DISPOSABLE) ×3 IMPLANT
CEMENT BONE SIMPLEX SPEEDSET (Cement) ×6 IMPLANT
CLOSURE WOUND 1/2 X4 (GAUZE/BANDAGES/DRESSINGS) ×1
COVER SURGICAL LIGHT HANDLE (MISCELLANEOUS) ×3 IMPLANT
COVER WAND RF STERILE (DRAPES) ×2 IMPLANT
CUFF TOURN SGL QUICK 34 (TOURNIQUET CUFF) ×2
CUFF TRNQT CYL 34X4X40X1 (TOURNIQUET CUFF) ×1 IMPLANT
DECANTER SPIKE VIAL GLASS SM (MISCELLANEOUS) ×6 IMPLANT
DRAPE INCISE IOBAN 66X45 STRL (DRAPES) ×6 IMPLANT
DRAPE U-SHAPE 47X51 STRL (DRAPES) ×3 IMPLANT
DRSG AQUACEL AG ADV 3.5X10 (GAUZE/BANDAGES/DRESSINGS) ×3 IMPLANT
DURAPREP 26ML APPLICATOR (WOUND CARE) ×6 IMPLANT
ELECT REM PT RETURN 15FT ADLT (MISCELLANEOUS) ×3 IMPLANT
FEMUR  CMT CCR STD SZ8 R KNEE (Knees) ×2 IMPLANT
FEMUR CMT CCR STD SZ8 R KNEE (Knees) ×1 IMPLANT
FEMUR CMTD CCR STD SZ8 R KNEE (Knees) IMPLANT
GLOVE BIOGEL M STRL SZ7.5 (GLOVE) ×3 IMPLANT
GLOVE BIOGEL PI IND STRL 7.5 (GLOVE) ×1 IMPLANT
GLOVE BIOGEL PI IND STRL 8.5 (GLOVE) ×2 IMPLANT
GLOVE BIOGEL PI INDICATOR 7.5 (GLOVE) ×2
GLOVE BIOGEL PI INDICATOR 8.5 (GLOVE) ×4
GLOVE SURG ORTHO 8.0 STRL STRW (GLOVE) ×9 IMPLANT
GOWN STRL REUS W/ TWL XL LVL3 (GOWN DISPOSABLE) ×2 IMPLANT
GOWN STRL REUS W/TWL XL LVL3 (GOWN DISPOSABLE) ×4
HANDPIECE INTERPULSE COAX TIP (DISPOSABLE) ×2
HOLDER FOLEY CATH W/STRAP (MISCELLANEOUS) ×3 IMPLANT
HOOD PEEL AWAY FLYTE STAYCOOL (MISCELLANEOUS) ×9 IMPLANT
MANIFOLD NEPTUNE II (INSTRUMENTS) ×3 IMPLANT
NS IRRIG 1000ML POUR BTL (IV SOLUTION) ×3 IMPLANT
PACK TOTAL KNEE CUSTOM (KITS) ×3 IMPLANT
PROTECTOR NERVE ULNAR (MISCELLANEOUS) ×3 IMPLANT
SET HNDPC FAN SPRY TIP SCT (DISPOSABLE) ×1 IMPLANT
STEM POLY PAT PLY 32M KNEE (Knees) ×2 IMPLANT
STEM TIBIA 5 DEG SZ E R KNEE (Knees) IMPLANT
STRIP CLOSURE SKIN 1/2X4 (GAUZE/BANDAGES/DRESSINGS) ×2 IMPLANT
SUT BONE WAX W31G (SUTURE) ×3 IMPLANT
SUT MNCRL AB 3-0 PS2 18 (SUTURE) ×3 IMPLANT
SUT STRATAFIX 0 PDS 27 VIOLET (SUTURE) ×3
SUT STRATAFIX PDS+ 0 24IN (SUTURE) ×3 IMPLANT
SUT VIC AB 1 CT1 36 (SUTURE) ×3 IMPLANT
SUTURE STRATFX 0 PDS 27 VIOLET (SUTURE) ×1 IMPLANT
SYR CONTROL 10ML LL (SYRINGE) ×6 IMPLANT
TAPE STRIPS DRAPE STRL (GAUZE/BANDAGES/DRESSINGS) ×2 IMPLANT
TIBIA STEM 5 DEG SZ E R KNEE (Knees) ×3 IMPLANT
TRAY FOLEY MTR SLVR 16FR STAT (SET/KITS/TRAYS/PACK) ×3 IMPLANT
WATER STERILE IRR 1000ML POUR (IV SOLUTION) ×6 IMPLANT
WRAP KNEE MAXI GEL POST OP (GAUZE/BANDAGES/DRESSINGS) ×3 IMPLANT
YANKAUER SUCT BULB TIP 10FT TU (MISCELLANEOUS) ×3 IMPLANT

## 2018-04-04 NOTE — Transfer of Care (Signed)
Immediate Anesthesia Transfer of Care Note  Patient: Michele Frost  Procedure(s) Performed: TOTAL KNEE ARTHROPLASTY (Right )  Patient Location: PACU  Anesthesia Type:Spinal  Level of Consciousness: awake, alert , oriented and patient cooperative  Airway & Oxygen Therapy: Patient Spontanous Breathing and Patient connected to face mask oxygen  Post-op Assessment: Report given to RN, Post -op Vital signs reviewed and stable and Patient moving all extremities  Post vital signs: Reviewed and stable  Last Vitals:  Vitals Value Taken Time  BP    Temp    Pulse 88 04/04/2018 11:23 AM  Resp 18 04/04/2018 11:23 AM  SpO2 100 % 04/04/2018 11:23 AM  Vitals shown include unvalidated device data.  Last Pain:  Vitals:   04/04/18 0930  TempSrc:   PainSc: 0-No pain      Patients Stated Pain Goal: 4 (04/04/18 0735)  Complications: No apparent anesthesia complications

## 2018-04-04 NOTE — Anesthesia Procedure Notes (Signed)
Anesthesia Regional Block: Adductor canal block   Pre-Anesthetic Checklist: ,, timeout performed, Correct Patient, Correct Site, Correct Laterality, Correct Procedure,, site marked, risks and benefits discussed, Surgical consent,  Pre-op evaluation,  At surgeon's request and post-op pain management  Laterality: Right  Prep: chloraprep       Needles:  Injection technique: Single-shot  Needle Type: Echogenic Stimulator Needle     Needle Length: 9cm  Needle Gauge: 21     Additional Needles:   Procedures:,,,, ultrasound used (permanent image in chart),,,,  Narrative:  Start time: 04/04/2018 9:15 AM End time: 04/04/2018 9:25 AM Injection made incrementally with aspirations every 5 mL.  Performed by: Personally  Anesthesiologist: Leonides Grills, MD  Additional Notes: Functioning IV was confirmed and monitors were applied. A time-out was performed. Hand hygiene and sterile gloves were used. The thigh was placed in a frog-leg position and prepped in a sterile fashion. A 5mm 21ga Arrow echogenic stimulator needle was placed using ultrasound guidance.  Negative aspiration and negative test dose prior to incremental administration of local anesthetic. The patient tolerated the procedure well.

## 2018-04-04 NOTE — Anesthesia Postprocedure Evaluation (Signed)
Anesthesia Post Note  Patient: Michele Frost  Procedure(s) Performed: TOTAL KNEE ARTHROPLASTY (Right )     Patient location during evaluation: PACU Anesthesia Type: Regional Level of consciousness: oriented and awake and alert Pain management: pain level controlled Vital Signs Assessment: post-procedure vital signs reviewed and stable Respiratory status: spontaneous breathing, respiratory function stable and patient connected to nasal cannula oxygen Cardiovascular status: blood pressure returned to baseline and stable Postop Assessment: no headache, no apparent nausea or vomiting, no backache and spinal receding Anesthetic complications: no    Last Vitals:  Vitals:   04/04/18 2021 04/04/18 2045  BP: (!) 113/51 122/64  Pulse: 81 91  Resp: 18 (!) 22  Temp: 36.8 C 36.6 C  SpO2: 94% 99%    Last Pain:  Vitals:   04/04/18 2045  TempSrc: Oral  PainSc:                  Catheryn Bacon Ellender

## 2018-04-04 NOTE — Evaluation (Signed)
Physical Therapy Evaluation Patient Details Name: Michele Frost MRN: [id number] DOB: 1951/03/17 Today's Date: 04/04/2018   History of Present Illness  R TKA  Clinical Impression  The patient reports that family support will be limited at Dc and [atient will  Need to be modified independnent  To get to BR and some  Small meals. Pt admitted with above diagnosis. Pt currently with functional limitations due to the deficits listed below (see PT Problem List).  Pt will benefit from skilled PT to increase their independence and safety with mobility to allow discharge to the venue listed below.       Follow Up Recommendations Home health PT    Equipment Recommendations  Rolling walker with 5" wheels    Recommendations for Other Services       Precautions / Restrictions Precautions Precautions: Knee      Mobility  Bed Mobility Overal bed mobility: Needs Assistance Bed Mobility: Supine to Sit     Supine to sit: Min assist     General bed mobility comments: support right ;eg, cues for technique  Transfers Overall transfer level: Needs assistance Equipment used: Rolling walker (2 wheeled) Transfers: Sit to/from Stand Sit to Stand: Min assist;From elevated surface         General transfer comment: Cues for hand and  right leg position  Ambulation/Gait Ambulation/Gait assistance: Min assist Gait Distance (Feet): 30 Feet Assistive device: Rolling walker (2 wheeled) Gait Pattern/deviations: Step-to pattern;Antalgic     General Gait Details: cues for sequence and for position inside RW  Stairs            Wheelchair Mobility    Modified Rankin (Stroke Patients Only)       Balance                                             Pertinent Vitals/Pain Pain Assessment: 0-10 Pain Score: 3  Pain Location: right knee Pain Descriptors / Indicators: Aching Pain Intervention(s): Monitored during session;Repositioned;Premedicated before session;Ice  applied    Home Living Family/patient expects to be discharged to:: Private residence Living Arrangements: Children Available Help at Discharge: Family Type of Home: House Home Access: Stairs to enter   Secretary/administrator of Steps: 1 Home Layout: One level;Multi-level Home Equipment: None Additional Comments: patient will need to be mod I for mobility, daughter  is unable to assist.    Prior Function Level of Independence: Independent               Hand Dominance        Extremity/Trunk Assessment   Upper Extremity Assessment Upper Extremity Assessment: Overall WFL for tasks assessed    Lower Extremity Assessment RLE Deficits / Details: + SLR, knee flexion 10-50       Communication   Communication: No difficulties  Cognition Arousal/Alertness: Awake/alert Behavior During Therapy: WFL for tasks assessed/performed Overall Cognitive Status: Within Functional Limits for tasks assessed                                        General Comments      Exercises     Assessment/Plan    PT Assessment Patient needs continued PT services  PT Problem List Decreased strength;Decreased range of motion;Decreased activity tolerance;Decreased mobility;Decreased knowledge of precautions;Decreased safety awareness;Decreased  knowledge of use of DME;Pain       PT Treatment Interventions DME instruction;Therapeutic exercise;Gait training;Stair training;Therapeutic activities;Functional mobility training;Patient/family education    PT Goals (Current goals can be found in the Care Plan section)  Acute Rehab PT Goals Patient Stated Goal: to be able to ambulate with RW by myself. PT Goal Formulation: With patient/family Time For Goal Achievement: 04/11/18 Potential to Achieve Goals: Good    Frequency 7X/week   Barriers to discharge Decreased caregiver support      Co-evaluation               AM-PAC PT "6 Clicks" Mobility  Outcome Measure Help  needed turning from your back to your side while in a flat bed without using bedrails?: A Little Help needed moving from lying on your back to sitting on the side of a flat bed without using bedrails?: A Little Help needed moving to and from a bed to a chair (including a wheelchair)?: A Lot Help needed standing up from a chair using your arms (e.g., wheelchair or bedside chair)?: A Lot Help needed to walk in hospital room?: A Lot Help needed climbing 3-5 steps with a railing? : A Lot 6 Click Score: 14    End of Session Equipment Utilized During Treatment: Gait belt Activity Tolerance: Patient tolerated treatment well Patient left: in chair;with chair alarm set Nurse Communication: Mobility status      Time: 4401-0272 PT Time Calculation (min) (ACUTE ONLY): 27 min   Charges:   PT Evaluation $PT Eval Low Complexity: 1 Low PT Treatments $Gait Training: 8-22 mins        Blanchard Kelch PT Acute Rehabilitation Services Pager [phone] Office [phone]   Rada Hay 04/04/2018, 5:10 PM

## 2018-04-04 NOTE — Anesthesia Procedure Notes (Signed)
Spinal  End time: 04/04/2018 9:46 AM Staffing Resident/CRNA: Victoriano Lain, CRNA Preanesthetic Checklist Completed: patient identified, site marked, surgical consent, pre-op evaluation, timeout performed, IV checked, risks and benefits discussed and monitors and equipment checked Spinal Block Patient position: sitting Prep: site prepped and draped and DuraPrep Patient monitoring: heart rate, continuous pulse ox and blood pressure Approach: midline Location: L3-4 Injection technique: single-shot Needle Needle type: Pencan  Needle gauge: 24 G Needle length: 10 cm Assessment Sensory level: T4 Additional Notes Pt placed in sitting position for spinal placement. Spinal kit expiration date checked and verified. One attempt. + CSF, - heme. Pt tolerated well.

## 2018-04-04 NOTE — Progress Notes (Signed)
AssistedDr. Ellender with right, ultrasound guided, adductor canal block. Side rails up, monitors on throughout procedure. See vital signs in flow sheet. Tolerated Procedure well.  

## 2018-04-04 NOTE — H&P (Signed)
MARTINEZ HENAULT MRN:  [id number] DOB/SEX:  [birth year]/female  CHIEF COMPLAINT:  Painful right Knee  HISTORY: Patient is a 68 y.o. female presented with a history of pain in the right knee. Onset of symptoms was gradual starting a few years ago with gradually worsening course since that time. Patient has been treated conservatively with over-the-counter NSAIDs and activity modification. Patient currently rates pain in the knee at 10 out of 10 with activity. There is pain at night.  PAST MEDICAL HISTORY: There are no active problems to display for this patient.  Past Medical History:  Diagnosis Date  . Diabetes mellitus without complication (HCC)    Type II  . Osteoarthritis of right knee    Severe   Past Surgical History:  Procedure Laterality Date  . DILATION AND CURETTAGE OF UTERUS    . EYE SURGERY     Bilateral Cataracts  . KNEE SURGERY Right   . TONSILLECTOMY       MEDICATIONS:   No medications prior to admission.    ALLERGIES:  No Known Allergies  REVIEW OF SYSTEMS:  A comprehensive review of systems was negative except for: Musculoskeletal: positive for arthralgias and bone pain   FAMILY HISTORY:  No family history on file.  SOCIAL HISTORY:   Social History   Tobacco Use  . Smoking status: Never Smoker  . Smokeless tobacco: Never Used  Substance Use Topics  . Alcohol use: No     EXAMINATION:  Vital signs in last 24 hours:    There were no vitals taken for this visit.  General Appearance:    Alert, cooperative, no distress, appears stated age  Head:    Normocephalic, without obvious abnormality, atraumatic  Eyes:    PERRL, conjunctiva/corneas clear, EOM's intact, fundi    benign, both eyes  Ears:    Normal TM's and external ear canals, both ears  Nose:   Nares normal, septum midline, mucosa normal, no drainage    or sinus tenderness  Throat:   Lips, mucosa, and tongue normal; teeth and gums normal  Neck:   Supple, symmetrical, trachea midline, no  adenopathy;    thyroid:  no enlargement/tenderness/nodules; no carotid   bruit or JVD  Back:     Symmetric, no curvature, ROM normal, no CVA tenderness  Lungs:     Clear to auscultation bilaterally, respirations unlabored  Chest Wall:    No tenderness or deformity   Heart:    Regular rate and rhythm, S1 and S2 normal, no murmur, rub   or gallop  Breast Exam:    No tenderness, masses, or nipple abnormality  Abdomen:     Soft, non-tender, bowel sounds active all four quadrants,    no masses, no organomegaly  Genitalia:    Normal female without lesion, discharge or tenderness  Rectal:    Normal tone, no masses or tenderness;   guaiac negative stool  Extremities:   Extremities normal, atraumatic, no cyanosis or edema  Pulses:   2+ and symmetric all extremities  Skin:   Skin color, texture, turgor normal, no rashes or lesions  Lymph nodes:   Cervical, supraclavicular, and axillary nodes normal  Neurologic:   CNII-XII intact, normal strength, sensation and reflexes    throughout    Musculoskeletal:  ROM 0-120, Ligaments intact,  Imaging Review Plain radiographs demonstrate severe degenerative joint disease of the right knee. The overall alignment is neutral. The bone quality appears to be excellent for age and reported activity level.  Assessment/Plan: Primary osteoarthritis,  right knee   The patient history, physical examination and imaging studies are consistent with advanced degenerative joint disease of the right knee. The patient has failed conservative treatment.  The clearance notes were reviewed.  After discussion with the patient it was felt that Total Knee Replacement was indicated. The procedure,  risks, and benefits of total knee arthroplasty were presented and reviewed. The risks including but not limited to aseptic loosening, infection, blood clots, vascular injury, stiffness, patella tracking problems complications among others were discussed. The patient acknowledged the  explanation, agreed to proceed with the plan.  Preoperative templating of the joint replacement has been completed, documented, and submitted to the Operating Room personnel in order to optimize intra-operative equipment management.    Patient's anticipated LOS is less than 2 midnights, meeting these requirements: - Lives within 1 hour of care - Has a competent adult at home to recover with post-op recover - NO history of  - Chronic pain requiring opiods  - Diabetes  - Coronary Artery Disease  - Heart failure  - Heart attack  - Stroke  - DVT/VTE  - Cardiac arrhythmia  - Respiratory Failure/COPD  - Renal failure  - Anemia  - Advanced Liver disease       Guy Sandifer 04/04/2018, 6:17 AM

## 2018-04-04 NOTE — Anesthesia Preprocedure Evaluation (Addendum)
Anesthesia Evaluation  Patient identified by MRN, date of birth, ID band Patient awake    Reviewed: Allergy & Precautions, NPO status , Patient's Chart, lab work & pertinent test results  Airway Mallampati: III  TM Distance: >3 FB Neck ROM: Full    Dental  (+) Chipped,    Pulmonary neg pulmonary ROS,    Pulmonary exam normal breath sounds clear to auscultation       Cardiovascular negative cardio ROS Normal cardiovascular exam Rhythm:Regular Rate:Normal  ECG: SR, PAC's, rate 95   Neuro/Psych negative neurological ROS  negative psych ROS   GI/Hepatic negative GI ROS, Neg liver ROS,   Endo/Other  diabetes, Insulin Dependent  Renal/GU negative Renal ROS     Musculoskeletal  (+) Arthritis ,   Abdominal (+) + obese,   Peds  Hematology negative hematology ROS (+)   Anesthesia Other Findings RIGHT KNEE OSTEOARTHRITIS  Reproductive/Obstetrics                            Anesthesia Physical Anesthesia Plan  ASA: II  Anesthesia Plan: Spinal and Regional   Post-op Pain Management:  Regional for Post-op pain   Induction:   PONV Risk Score and Plan: 2 and Propofol infusion and Treatment may vary due to age or medical condition  Airway Management Planned: Natural Airway  Additional Equipment:   Intra-op Plan:   Post-operative Plan:   Informed Consent: I have reviewed the patients History and Physical, chart, labs and discussed the procedure including the risks, benefits and alternatives for the proposed anesthesia with the patient or authorized representative who has indicated his/her understanding and acceptance.   Dental advisory given  Plan Discussed with: CRNA  Anesthesia Plan Comments:         Anesthesia Quick Evaluation

## 2018-04-05 ENCOUNTER — Encounter (HOSPITAL_COMMUNITY): Payer: Self-pay | Admitting: Orthopedic Surgery

## 2018-04-05 DIAGNOSIS — M1711 Unilateral primary osteoarthritis, right knee: Secondary | ICD-10-CM | POA: Diagnosis not present

## 2018-04-05 LAB — BASIC METABOLIC PANEL
Anion gap: 7 (ref 5–15)
BUN: 25 mg/dL — ABNORMAL HIGH (ref 8–23)
CHLORIDE: 109 mmol/L (ref 98–111)
CO2: 25 mmol/L (ref 22–32)
Calcium: 8.8 mg/dL — ABNORMAL LOW (ref 8.9–10.3)
Creatinine, Ser: 1.32 mg/dL — ABNORMAL HIGH (ref 0.44–1.00)
GFR calc Af Amer: 48 mL/min — ABNORMAL LOW (ref >60)
GFR calc non Af Amer: 42 mL/min — ABNORMAL LOW (ref >60)
Glucose, Bld: 137 mg/dL — ABNORMAL HIGH (ref 70–99)
Potassium: 4 mmol/L (ref 3.5–5.1)
Sodium: 141 mmol/L (ref 135–145)

## 2018-04-05 LAB — CBC
HCT: 30.1 % — ABNORMAL LOW (ref 36.0–46.0)
HEMOGLOBIN: 9.5 g/dL — AB (ref 12.0–15.0)
MCH: 29.6 pg (ref 26.0–34.0)
MCHC: 31.6 g/dL (ref 30.0–36.0)
MCV: 93.8 fL (ref 80.0–100.0)
Platelets: 215 10*3/uL (ref 150–400)
RBC: 3.21 MIL/uL — AB (ref 3.87–5.11)
RDW: 13.1 % (ref 11.5–15.5)
WBC: 10.7 10*3/uL — ABNORMAL HIGH (ref 4.0–10.5)
nRBC: 0 % (ref 0.0–0.2)

## 2018-04-05 LAB — GLUCOSE, CAPILLARY
GLUCOSE-CAPILLARY: 167 mg/dL — AB (ref 70–99)
GLUCOSE-CAPILLARY: 148 mg/dL — AB (ref 70–99)
Glucose-Capillary: 110 mg/dL — ABNORMAL HIGH (ref 70–99)
Glucose-Capillary: 244 mg/dL — ABNORMAL HIGH (ref 70–99)

## 2018-04-05 MED ORDER — METHOCARBAMOL 500 MG PO TABS: 500.0000 mg | ORAL_TABLET | Freq: Four times a day (QID) | ORAL | 0 refills | Status: AC | PRN

## 2018-04-05 MED ORDER — ACETAMINOPHEN 500 MG PO TABS: 1000.0000 mg | ORAL_TABLET | Freq: Four times a day (QID) | ORAL | 0 refills | Status: AC

## 2018-04-05 MED ORDER — ASPIRIN 325 MG PO TBEC: 325.0000 mg | DELAYED_RELEASE_TABLET | Freq: Two times a day (BID) | ORAL | 0 refills | Status: AC

## 2018-04-05 MED ORDER — OXYCODONE HCL 5 MG PO TABS: 5.0000 mg | ORAL_TABLET | Freq: Four times a day (QID) | ORAL | 0 refills | Status: AC | PRN

## 2018-04-05 NOTE — Progress Notes (Signed)
Patient's CBG is 205, and administered 1 unit of humalog which patient brought from home. No significant events noted. Monitoring continues.

## 2018-04-05 NOTE — Care Management Obs Status (Signed)
MEDICARE OBSERVATION STATUS NOTIFICATION   Patient Details  Name: Michele Frost MRN: [id number] Date of Birth: January 18, 1951   Medicare Observation Status Notification Given:  Yes    Alexis Goodell, RN 04/05/2018, 12:25 PM

## 2018-04-05 NOTE — Plan of Care (Signed)
  Problem: Activity: Goal: Risk for activity intolerance will decrease Outcome: Progressing   Problem: Coping: Goal: Level of anxiety will decrease Outcome: Progressing   Problem: Nutrition: Goal: Adequate nutrition will be maintained Outcome: Progressing   Problem: Pain Managment: Goal: General experience of comfort will improve Outcome: Progressing

## 2018-04-05 NOTE — Progress Notes (Signed)
SPORTS MEDICINE AND JOINT REPLACEMENT  Georgena Spurling, MD    Laurier Nancy, PA-C [address] [place], [place], [place]  [address]                             [phone]   PROGRESS NOTE  Subjective:  negative for Chest Pain  negative for Shortness of Breath  negative for Nausea/Vomiting   negative for Calf Pain  negative for Bowel Movement   Tolerating Diet: yes         Patient reports pain as 3 on 0-10 scale.    Objective: Vital signs in last 24 hours:    Patient Vitals for the past 24 hrs:  BP Temp Temp src Pulse Resp SpO2 Height Weight  04/05/18 0536 (!) 149/86 97.6 F (36.4 C) Oral 83 16 99 % - -  04/05/18 0151 (!) 144/83 97.7 F (36.5 C) Oral 87 16 98 % - -  04/04/18 2045 122/64 97.9 F (36.6 C) Oral 91 (!) 22 99 % - -  04/04/18 2021 (!) 113/51 98.3 F (36.8 C) Oral 81 18 94 % - -  04/04/18 1700 (!) 162/90 - - (!) 103 14 98 % - -  04/04/18 1516 (!) 162/88 97.8 F (36.6 C) Oral 88 14 100 % - -  04/04/18 1414 130/85 97.6 F (36.4 C) Oral 68 16 100 % - -  04/04/18 1410 (!) 167/90 98.1 F (36.7 C) Oral 89 14 100 % - -  04/04/18 1304 (!) 179/99 98 F (36.7 C) Oral 88 18 100 % - -  04/04/18 1230 (!) 171/93 - - 84 11 100 % - -  04/04/18 1215 (!) 170/88 - - 83 (!) 26 99 % - -  04/04/18 1200 (!) 172/85 - - 87 15 99 % - -  04/04/18 1145 (!) 162/81 - - 88 (!) 22 100 % - -  04/04/18 1130 (!) 143/83 - - 87 16 100 % - -  04/04/18 1122 (!) 142/71 97.8 F (36.6 C) - 88 18 98 % - -  04/04/18 0931 - - - 76 (!) 6 100 % - -  04/04/18 0930 (!) 171/109 - - 76 11 100 % - -  04/04/18 0929 - - - 75 10 100 % - -  04/04/18 0928 - - - 76 12 99 % - -  04/04/18 0927 - - - 75 - 100 % - -  04/04/18 0926 - - - 75 - 100 % - -  04/04/18 0925 (!) 182/91 - - 76 12 100 % - -  04/04/18 0924 - - - 77 11 100 % - -  04/04/18 0923 - - - 77 20 100 % - -  04/04/18 0922 - - - 75 19 100 % - -  04/04/18 0921 - - - 76 12 100 % - -  04/04/18 0735 - - - - - - 5\' 6"  (1.676 m) 97.7 kg  04/04/18 0720 (!)  161/96 (!) 97.5 F (36.4 C) Oral 88 19 100 % - -    @flow {1959:LAST@   Intake/Output from previous day:   01/06 0701 - 01/07 0700 In: 4101.8 [P.O.:480; I.V.:3221.8] Out: 3625 [Urine:3575]   Intake/Output this shift:   No intake/output data recorded.   Intake/Output      01/06 0701 - 01/07 0700 01/07 0701 - 01/08 0700   P.O. 480    I.V. (mL/kg) 3221.8 (33)    IV Piggyback 400  Total Intake(mL/kg) 4101.8 (42)    Urine (mL/kg/hr) 3575 (1.5)    Blood 50    Total Output 3625    Net +476.8            LABORATORY DATA: Recent Labs    04/05/18 0410  WBC 10.7*  HGB 9.5*  HCT 30.1*  PLT 215   Recent Labs    04/05/18 0410  NA 141  K 4.0  CL 109  CO2 25  BUN 25*  CREATININE 1.32*  GLUCOSE 137*  CALCIUM 8.8*   No results found for: INR, PROTIME  Examination:  General appearance: alert, cooperative and no distress Extremities: extremities normal, atraumatic, no cyanosis or edema  Wound Exam: clean, dry, intact   Drainage:  None: wound tissue dry  Motor Exam: Quadriceps and Hamstrings Intact  Sensory Exam: Superficial Peroneal, Deep Peroneal and Tibial normal   Assessment:    1 Day Post-Op  Procedure(s) (LRB): TOTAL KNEE ARTHROPLASTY (Right)  ADDITIONAL DIAGNOSIS:  Active Problems:   S/P total knee replacement     Plan: Physical Therapy as ordered Weight Bearing as Tolerated (WBAT)  DVT Prophylaxis:  Aspirin  DISCHARGE PLAN: Home  DISCHARGE NEEDS: HHPT     Patient doing well, likely D/C home tomorrow  Patient's anticipated LOS is less than 2 midnights, meeting these requirements: - Lives within 1 hour of care - Has a competent adult at home to recover with post-op recover - NO history of  - Chronic pain requiring opiods  - Diabetes  - Coronary Artery Disease  - Heart failure  - Heart attack  - Stroke  - DVT/VTE  - Cardiac arrhythmia  - Respiratory Failure/COPD  - Renal failure  - Anemia  - Advanced Liver disease        Guy Sandifer 04/05/2018, 7:09 AM

## 2018-04-05 NOTE — Care Management Note (Signed)
Case Management Note  Patient Details  Name: [name] MRN: [id number] Date of Birth: 1950/12/09  Subjective/Objective:      Discharge planning, spoke with patient and spouse at bedside. Have chosen [facility] for [facility] PT, evaluate and treat.   Action/Plan: Contacted [facility] for referral. They have accepted. Has DME. [phone]               Expected Discharge Date:                  Expected Discharge Plan:  Home w Home Health Services  In-House Referral:  NA  Discharge planning Services  CM Consult  Post Acute Care Choice:  Home Health Choice offered to:  Patient  DME Arranged:  N/A DME Agency:  NA  HH Arranged:  PT HH Agency:  [facility] ([facility])  Status of Service:  Completed, signed off  If discussed at [employer], dates discussed:    Additional Comments:  Alexis Goodell, RN 04/05/2018, 12:34 PM

## 2018-04-05 NOTE — Progress Notes (Signed)
Physical Therapy Treatment Patient Details Name: Michele Frost MRN: [id number] DOB: Mar 11, 1951 Today's Date: 04/05/2018    History of Present Illness R TKA    PT Comments    The patient progressing well today.Plans Dc tomorrow. Patient Will need to be as modified  Independent as possible as family is limited.  Follow Up Recommendations  Home health PT     Equipment Recommendations  Rolling walker with 5" wheels    Recommendations for Other Services       Precautions / Restrictions Precautions Precautions: Knee    Mobility  Bed Mobility               General bed mobility comments: OOB  Transfers   Equipment used: Rolling walker (2 wheeled) Transfers: Sit to/from Stand Sit to Stand: Min guard;Min assist         General transfer comment: Cues for hand and  right leg position  Ambulation/Gait Ambulation/Gait assistance: Min assist Gait Distance (Feet): 60 Feet Assistive device: Rolling walker (2 wheeled) Gait Pattern/deviations: Step-through pattern;Antalgic     General Gait Details: cues for sequence and for position inside RW   Stairs             Wheelchair Mobility    Modified Rankin (Stroke Patients Only)       Balance                                            Cognition Arousal/Alertness: Awake/alert                                            Exercises Total Joint Exercises Ankle Circles/Pumps: AROM Quad Sets: AROM Short Arc QuadBarbaraann Boys;Right;10 reps Heel Slides: 10 reps;AAROM;Right Hip ABduction/ADduction: Right;10 reps;AAROM Straight Leg Raises: Right;10 reps;AAROM Long Arc Quad: AAROM;Right;10 reps Goniometric ROM: 10-60 right knee flexion    General Comments        Pertinent Vitals/Pain Pain Location: right knee Pain Descriptors / Indicators: Discomfort Pain Intervention(s): Premedicated before session;Monitored during session;Ice applied    Home Living                      Prior Function            PT Goals (current goals can now be found in the care plan section) Progress towards PT goals: Progressing toward goals    Frequency    7X/week      PT Plan Current plan remains appropriate    Co-evaluation              AM-PAC PT "6 Clicks" Mobility   Outcome Measure  Help needed turning from your back to your side while in a flat bed without using bedrails?: A Little Help needed moving from lying on your back to sitting on the side of a flat bed without using bedrails?: A Little Help needed moving to and from a bed to a chair (including a wheelchair)?: A Little Help needed standing up from a chair using your arms (e.g., wheelchair or bedside chair)?: A Little Help needed to walk in hospital room?: A Lot Help needed climbing 3-5 steps with a railing? : A Lot 6 Click Score: 16    End of Session Equipment Utilized During Treatment: Gait  belt Activity Tolerance: Patient tolerated treatment well Patient left: in chair;with call bell/phone within reach Nurse Communication: Mobility status PT Visit Diagnosis: Unsteadiness on feet (R26.81);Pain Pain - Right/Left: Right Pain - part of body: Knee     Time: 1610-96041016-1038 PT Time Calculation (min) (ACUTE ONLY): 22 min  Charges:  $Gait Training: 8-22 mins                     Blanchard KelchKaren Avis Mcmahill PT Acute Rehabilitation Services Pager [phone] Office [phone]    Rada HayHill, Indiah Heyden Elizabeth 04/05/2018, 12:28 PM

## 2018-04-05 NOTE — Progress Notes (Signed)
Physical Therapy Treatment Patient Details Name: Michele Frost MRN: [id number] DOB: November 02, 1950 Today's Date: 04/05/2018    History of Present Illness R TKA    PT Comments    Patient is progressing. Will need to be near modified independent  At home. Continue PT.   Follow Up Recommendations  Home health PT     Equipment Recommendations  Rolling walker with 5" wheels    Recommendations for Other Services       Precautions / Restrictions Precautions Precautions: Knee;Fall    Mobility  Bed Mobility   Bed Mobility: Supine to Sit;Sit to Supine     Supine to sit: Supervision     General bed mobility comments: patient manages leg  Transfers Overall transfer level: Needs assistance Equipment used: Rolling walker (2 wheeled) Transfers: Sit to/from Stand Sit to Stand: Min guard         General transfer comment: Cues for hand and  right leg position  Ambulation/Gait Ambulation/Gait assistance: Min assist Gait Distance (Feet): 60 Feet Assistive device: Rolling walker (2 wheeled) Gait Pattern/deviations: Step-to pattern;Step-through pattern     General Gait Details: cues for sequence and for position inside RW   Stairs             Wheelchair Mobility    Modified Rankin (Stroke Patients Only)       Balance                                            Cognition Arousal/Alertness: Awake/alert                                            Exercises Total Joint Exercises Ankle Circles/Pumps: AROM Quad Sets: AROM;Right;10 reps Short Arc QuadBarbaraann Boys;Right;10 reps Heel Slides: 10 reps;AAROM;Right Hip ABduction/ADduction: Right;10 reps;AAROM Straight Leg Raises: Right;10 reps;AAROM Long Arc Quad: AAROM;Right;10 reps Goniometric ROM: 10-60 right knee flexion    General Comments        Pertinent Vitals/Pain Pain Score: 3  Pain Location: right knee Pain Descriptors / Indicators: Discomfort Pain Intervention(s):  Premedicated before session    Home Living                      Prior Function            PT Goals (current goals can now be found in the care plan section) Progress towards PT goals: Progressing toward goals    Frequency    7X/week      PT Plan Current plan remains appropriate    Co-evaluation              AM-PAC PT "6 Clicks" Mobility   Outcome Measure  Help needed turning from your back to your side while in a flat bed without using bedrails?: A Little Help needed moving from lying on your back to sitting on the side of a flat bed without using bedrails?: A Little Help needed moving to and from a bed to a chair (including a wheelchair)?: A Little Help needed standing up from a chair using your arms (e.g., wheelchair or bedside chair)?: A Little Help needed to walk in hospital room?: A Little Help needed climbing 3-5 steps with a railing? : A Lot 6 Click Score: 17  End of Session Equipment Utilized During Treatment: Gait belt Activity Tolerance: Patient tolerated treatment well Patient left: in bed;with call bell/phone within reach;with family/visitor present Nurse Communication: Mobility status PT Visit Diagnosis: Unsteadiness on feet (R26.81);Pain Pain - Right/Left: Right Pain - part of body: Knee     Time: [id number] PT Time Calculation (min) (ACUTE ONLY): 20 min  Charges:  $Gait Training: 8-22 mins                     Blanchard KelchKaren Katya Rolston PT Acute Rehabilitation Services Pager [phone] Office [phone]    Rada HayHill, Tytionna Cloyd Elizabeth 04/05/2018, 3:39 PM

## 2018-04-06 DIAGNOSIS — M1711 Unilateral primary osteoarthritis, right knee: Secondary | ICD-10-CM | POA: Diagnosis not present

## 2018-04-06 LAB — GLUCOSE, CAPILLARY
Glucose-Capillary: 173 mg/dL — ABNORMAL HIGH (ref 70–99)
Glucose-Capillary: 134 mg/dL — ABNORMAL HIGH (ref 70–99)
Glucose-Capillary: 99 mg/dL (ref 70–99)

## 2018-04-06 LAB — CBC
HCT: 34.3 % — ABNORMAL LOW (ref 36.0–46.0)
HEMOGLOBIN: 10.8 g/dL — AB (ref 12.0–15.0)
MCH: 29.4 pg (ref 26.0–34.0)
MCHC: 31.5 g/dL (ref 30.0–36.0)
MCV: 93.5 fL (ref 80.0–100.0)
Platelets: 239 10*3/uL (ref 150–400)
RBC: 3.67 MIL/uL — ABNORMAL LOW (ref 3.87–5.11)
RDW: 13.1 % (ref 11.5–15.5)
WBC: 10.2 10*3/uL (ref 4.0–10.5)
nRBC: 0 % (ref 0.0–0.2)

## 2018-04-06 NOTE — Plan of Care (Signed)
  Problem: Pain Managment: Goal: General experience of comfort will improve Outcome: Progressing   Problem: Safety: Goal: Ability to remain free from injury will improve Outcome: Progressing   

## 2018-04-06 NOTE — Progress Notes (Signed)
Physical Therapy Treatment Patient Details Name: Michele Frost MRN: [id number] DOB: October 09, 1950 Today's Date: 04/06/2018    History of Present Illness R TKA    PT Comments    Pt attempted ambulating in hallway however became nauseated and dizzy so recliner brought for pt.  Will return to mobilize again and practice step prior to d/c.  Follow Up Recommendations  Home health PT     Equipment Recommendations  Rolling walker with 5" wheels    Recommendations for Other Services       Precautions / Restrictions Precautions Precautions: Knee;Fall Restrictions Weight Bearing Restrictions: No    Mobility  Bed Mobility   Bed Mobility: Supine to Sit     Supine to sit: Supervision     General bed mobility comments: pt self assists R LE  Transfers Overall transfer level: Needs assistance Equipment used: Rolling walker (2 wheeled) Transfers: Sit to/from Stand Sit to Stand: Min assist         General transfer comment: verbal cues for hand and LE positioning, assist to control descent as pt with dizziness upon ambulating  Ambulation/Gait Ambulation/Gait assistance: Min assist Gait Distance (Feet): 30 Feet Assistive device: Rolling walker (2 wheeled) Gait Pattern/deviations: Step-to pattern     General Gait Details: cues for sequence and for position inside RW, pt became dizzy and nauseated so recliner brought behind pt, vitals checked and stable (RN came to assist and aware, to check glucose) pt reported nausea improved after a few minutes reclined   Stairs             Wheelchair Mobility    Modified Rankin (Stroke Patients Only)       Balance                                            Cognition Arousal/Alertness: Awake/alert Behavior During Therapy: WFL for tasks assessed/performed Overall Cognitive Status: Within Functional Limits for tasks assessed                                        Exercises       General Comments        Pertinent Vitals/Pain Pain Assessment: 0-10 Pain Score: 5  Pain Location: right knee and proxmial tibia "shin" Pain Descriptors / Indicators: Discomfort;Sore Pain Intervention(s): Premedicated before session;Monitored during session;Limited activity within patient's tolerance;Repositioned;Ice applied    Home Living                      Prior Function            PT Goals (current goals can now be found in the care plan section) Progress towards PT goals: Progressing toward goals    Frequency    7X/week      PT Plan Current plan remains appropriate    Co-evaluation              AM-PAC PT "6 Clicks" Mobility   Outcome Measure  Help needed turning from your back to your side while in a flat bed without using bedrails?: A Little Help needed moving from lying on your back to sitting on the side of a flat bed without using bedrails?: A Little Help needed moving to and from a bed to a chair (including a wheelchair)?:  A Little Help needed standing up from a chair using your arms (e.g., wheelchair or bedside chair)?: A Little Help needed to walk in hospital room?: A Little Help needed climbing 3-5 steps with a railing? : A Lot 6 Click Score: 17    End of Session Equipment Utilized During Treatment: Gait belt Activity Tolerance: (limited by nausea and dizziness) Patient left: with call bell/phone within reach;in chair;with chair alarm set   PT Visit Diagnosis: Unsteadiness on feet (R26.81);Pain Pain - Right/Left: Right     Time: 4332-9518 PT Time Calculation (min) (ACUTE ONLY): 16 min  Charges:  $Gait Training: 8-22 mins                    Zenovia Jarred, PT, DPT Acute Rehabilitation Services Office: [phone] Pager: [phone]   Sarajane Jews 04/06/2018, 2:17 PM

## 2018-04-06 NOTE — Progress Notes (Signed)
Patient discharged to home w/ dtr. Given all belongings, instructions, prescriptions, equipment. Verbalized understanding of all instructions. Escorted to pov via w/c. 

## 2018-04-06 NOTE — Progress Notes (Signed)
Physical Therapy Treatment Patient Details Name: Michele Frost MRN: [id number] DOB: 10/07/1950 Today's Date: 04/06/2018    History of Present Illness R TKA    PT Comments    Pt eager to d/c home so returned to mobilize, and pt denies dizziness and nausea this afternoon.  Pt also performed one step and reports understanding.  Verbally reviewed HEP handout and pt reports understanding.  Pt wishes to d/c home today.    Follow Up Recommendations  Home health PT     Equipment Recommendations  Rolling walker with 5" wheels    Recommendations for Other Services       Precautions / Restrictions Precautions Precautions: Knee;Fall Restrictions Weight Bearing Restrictions: No    Mobility  Bed Mobility   Bed Mobility: Supine to Sit     Supine to sit: Supervision     General bed mobility comments: pt up in recliner on arrival  Transfers Overall transfer level: Needs assistance Equipment used: Rolling walker (2 wheeled) Transfers: Sit to/from Stand Sit to Stand: Min guard         General transfer comment: verbal cues for hand and LE positioning  Ambulation/Gait Ambulation/Gait assistance: Min guard Gait Distance (Feet): 80 Feet Assistive device: Rolling walker (2 wheeled) Gait Pattern/deviations: Step-to pattern;Decreased stance time - right;Trunk flexed     General Gait Details: verbal cues for sequence, RW positioning, step length, no dizziness or nausea this afternoon   Stairs Stairs: Yes Stairs assistance: Min guard Stair Management: Step to pattern;Forwards;With walker Number of Stairs: 1 General stair comments: verbal cues for sequence, RW positioning, and safety, pt reports understanding   Wheelchair Mobility    Modified Rankin (Stroke Patients Only)       Balance                                            Cognition Arousal/Alertness: Awake/alert Behavior During Therapy: WFL for tasks assessed/performed Overall Cognitive  Status: Within Functional Limits for tasks assessed                                        Exercises      General Comments        Pertinent Vitals/Pain Pain Assessment: 0-10 Pain Score: 3  Pain Location: right knee and proxmial tibia "shin" Pain Descriptors / Indicators: Discomfort;Sore Pain Intervention(s): Monitored during session;Limited activity within patient's tolerance;Premedicated before session    Home Living                      Prior Function            PT Goals (current goals can now be found in the care plan section) Progress towards PT goals: Progressing toward goals    Frequency    7X/week      PT Plan Current plan remains appropriate    Co-evaluation              AM-PAC PT "6 Clicks" Mobility   Outcome Measure  Help needed turning from your back to your side while in a flat bed without using bedrails?: A Little Help needed moving from lying on your back to sitting on the side of a flat bed without using bedrails?: A Little Help needed moving to and from a bed  to a chair (including a wheelchair)?: A Little Help needed standing up from a chair using your arms (e.g., wheelchair or bedside chair)?: A Little Help needed to walk in hospital room?: A Little Help needed climbing 3-5 steps with a railing? : A Little 6 Click Score: 18    End of Session Equipment Utilized During Treatment: Gait belt Activity Tolerance: Patient tolerated treatment well Patient left: with call bell/phone within reach;in chair;with chair alarm set Nurse Communication: Mobility status PT Visit Diagnosis: Unsteadiness on feet (R26.81);Pain Pain - Right/Left: Right Pain - part of body: Knee     Time: 1220-1232 PT Time Calculation (min) (ACUTE ONLY): 12 min  Charges:  $Gait Training: 8-22 mins                     Zenovia JarredKati Jayesh Marbach, PT, DPT Acute Rehabilitation Services Office: [phone] Pager: [phone]  Sarajane JewsLEMYRE,KATHrine E 04/06/2018,  2:20 PM

## 2018-04-06 NOTE — Op Note (Signed)
TOTAL KNEE REPLACEMENT OPERATIVE NOTE:  04/04/2018  2:23 PM  PATIENT:  Michele Frost  68 y.o. female  PRE-OPERATIVE DIAGNOSIS:  RT. KNEE OSTEOARTHRITIS  POST-OPERATIVE DIAGNOSIS:  RT. KNEE OSTEOARTHRITIS  PROCEDURE:  Procedure(s): TOTAL KNEE ARTHROPLASTY  SURGEON:  Surgeon(s): Dannielle HuhLucey, Brook Mall, MD  PHYSICIAN ASSISTANT: Laurier Nancyolby Robbins, PA-C  ANESTHESIA:   spinal  SPECIMEN: None  COUNTS:  Correct  TOURNIQUET:   Total Tourniquet Time Documented: Thigh (Right) - 37 minutes Total: Thigh (Right) - 37 minutes   DICTATION:  Indication for procedure:    The patient is a 68 y.o. female who has failed conservative treatment for RT. KNEE OSTEOARTHRITIS.  Informed consent was obtained prior to anesthesia. The risks versus benefits of the operation were explain and in a way the patient can, and did, understand.   On the implant demand matching protocol, this patient scored 6.  Therefore, this patient was not receive a polyethylene insert with vitamin E which is a high demand implant.  Description of procedure:     The patient was taken to the operating room and placed under anesthesia.  The patient was positioned in the usual fashion taking care that all body parts were adequately padded and/or protected.  A tourniquet was applied and the leg prepped and draped in the usual sterile fashion.  The extremity was exsanguinated with the esmarch and tourniquet inflated to 350 mmHg.  Pre-operative range of motion was normal.  The knee was in 4 degree of mild valgus.  A midline incision approximately 6-7 inches long was made with a #10 blade.  A new blade was used to make a parapatellar arthrotomy going 2-3 cm into the quadriceps tendon, over the patella, and alongside the medial aspect of the patellar tendon.  A synovectomy was then performed with the #10 blade and forceps. I then elevated the deep MCL off the medial tibial metaphysis subperiosteally around to the semimembranosus attachment.    I  everted the patella and used calipers to measure patellar thickness.  I used the reamer to ream down to appropriate thickness to recreate the native thickness.  I then removed excess bone with the rongeur and sagittal saw.  I used the appropriately sized template and drilled the three lug holes.  I then put the trial in place and measured the thickness with the calipers to ensure recreation of the native thickness.  The trial was then removed and the patella subluxed and the knee brought into flexion.  A homan retractor was place to retract and protect the patella and lateral structures.  A Z-retractor was place medially to protect the medial structures.  The extra-medullary alignment system was used to make cut the tibial articular surface perpendicular to the anamotic axis of the tibia and in 3 degrees of posterior slope.  The cut surface and alignment jig was removed.  I then used the intramedullary alignment guide to make a 4 valgus cut on the distal femur.  I then marked out the epicondylar axis on the distal femur.  The posterior condylar axis measured 8 degrees.  I then used the anterior referencing sizer and measured the femur to be a size 8.  The 4-In-1 cutting block was screwed into place in external rotation matching the posterior condylar angle, making our cuts perpendicular to the epicondylar axis.  Anterior, posterior and chamfer cuts were made with the sagittal saw.  The cutting block and cut pieces were removed.  A lamina spreader was placed in 90 degrees of flexion.  The ACL, PCL, menisci, and posterior condylar osteophytes were removed.  A 10 mm spacer blocked was found to offer good flexion and extension gap balance after minimal in degree releasing.   The scoop retractor was then placed and the femoral finishing block was pinned in place.  The small sagittal saw was used as well as the lug drill to finish the femur.  The block and cut surfaces were removed and the medullary canal hole  filled with autograft bone from the cut pieces.  The tibia was delivered forward in deep flexion and external rotation.  A size E tray was selected and pinned into place centered on the medial 1/3 of the tibial tubercle.  The reamer and keel was used to prepare the tibia through the tray.    I then trialed with the size 8 femur, size E tibia, a 10 mm insert and the 32 patella.  I had excellent flexion/extension gap balance, excellent patella tracking.  Flexion was full and beyond 120 degrees; extension was zero.  These components were chosen and the staff opened them to me on the back table while the knee was lavaged copiously and the cement mixed.  The soft tissue was infiltrated with 60cc of exparel 1.3% through a 21 gauge needle.  I cemented in the components and removed all excess cement.  The polyethylene tibial component was snapped into place and the knee placed in extension while cement was hardening.  The capsule was infilltrated with a 60cc exparel/marcaine/saline mixture.   Once the cement was hard, the tourniquet was let down.  Hemostasis was obtained.  The arthrotomy was closed using a #1 stratofix running suture.  The deep soft tissues were closed with #0 vicryls and the subcuticular layer closed with #2-0 vicryl.  The skin was reapproximated and closed with 3.0 Monocryl.  The wound was covered with steristrips, aquacel dressing, and a TED stocking.   The patient was then awakened, extubated, and taken to the recovery room in stable condition.  BLOOD LOSS:  300cc COMPLICATIONS:  None.  PLAN OF CARE: Admit for overnight observation  PATIENT DISPOSITION:  PACU - hemodynamically stable.   Delay start of Pharmacological VTE agent (>24hrs) due to surgical blood loss or risk of bleeding:  not applicable  Please fax a copy of this op note to my office at [phone] (please only include page 1 and 2 of the Case Information op note)

## 2018-04-06 NOTE — Discharge Summary (Signed)
SPORTS MEDICINE & JOINT REPLACEMENT   Georgena Spurling, MD   Laurier Nancy, PA-C [address] [place], [place], [place]  [address]                             [phone]  PATIENT ID: Michele Frost        MRN:  [id number]          DOB/AGE: 68-Jul-1952 / 68 y.o.    DISCHARGE SUMMARY  ADMISSION DATE:    04/04/2018 DISCHARGE DATE:   04/06/2018   ADMISSION DIAGNOSIS: RT. KNEE OSTEOARTHRITIS    DISCHARGE DIAGNOSIS:  RT. KNEE OSTEOARTHRITIS    ADDITIONAL DIAGNOSIS: Active Problems:   S/P total knee replacement  Past Medical History:  Diagnosis Date  . Diabetes mellitus without complication (HCC)    Type II  . Osteoarthritis of right knee    Severe    PROCEDURE: Procedure(s): TOTAL KNEE ARTHROPLASTY on 04/04/2018  CONSULTS:    HISTORY:  See H&P in chart  HOSPITAL COURSE:  Michele Frost is a 68 y.o. admitted on 04/04/2018 and found to have a diagnosis of RT. KNEE OSTEOARTHRITIS.  After appropriate laboratory studies were obtained  they were taken to the operating room on 04/04/2018 and underwent Procedure(s): TOTAL KNEE ARTHROPLASTY.   They were given perioperative antibiotics:  Anti-infectives (From admission, onward)   Start     Dose/Rate Route Frequency Ordered Stop   04/04/18 1600  ceFAZolin (ANCEF) IVPB 2g/100 mL premix     2 g 200 mL/hr over 30 Minutes Intravenous Every 6 hours 04/04/18 1324 04/04/18 2230   04/04/18 0730  ceFAZolin (ANCEF) IVPB 2g/100 mL premix     2 g 200 mL/hr over 30 Minutes Intravenous On call to O.R. 04/04/18 0720 04/04/18 1007    .  Patient given tranexamic acid IV or topical and exparel intra-operatively.  Tolerated the procedure well.    POD# 1: Vital signs were stable.  Patient denied Chest pain, shortness of breath, or calf pain.  Patient was started on Aspirin twice daily at 8am.  Consults to PT, OT, and care management were made.  The patient was weight bearing as tolerated.  CPM was placed on the operative leg 0-90 degrees for 6-8 hours a day.  When out of the CPM, patient was placed in the foam block to achieve full extension. Incentive spirometry was taught.  Dressing was changed.       POD #2, Continued  PT for ambulation and exercise program.  IV saline locked.  O2 discontinued.    The remainder of the hospital course was dedicated to ambulation and strengthening.   The patient was discharged on 2 Days Post-Op in  Good condition.  Blood products given:none  DIAGNOSTIC STUDIES: Recent vital signs:  Patient Vitals for the past 24 hrs:  BP Temp Temp src Pulse Resp SpO2  04/06/18 0405 (!) 165/98 (!) 97.5 F (36.4 C) Oral 91 16 100 %  04/05/18 1958 140/78 98 F (36.7 C) Oral 100 16 95 %  04/05/18 1356 (!) 151/127 98.3 F (36.8 C) Oral 97 18 99 %       Recent laboratory studies: Recent Labs    04/05/18 0410 04/06/18 0459  WBC 10.7* 10.2  HGB 9.5* 10.8*  HCT 30.1* 34.3*  PLT 215 239   Recent Labs    04/05/18 0410  NA 141  K 4.0  CL 109  CO2 25  BUN 25*  CREATININE 1.32*  GLUCOSE 137*  CALCIUM 8.8*   No results found for: INR, PROTIME   Recent Radiographic Studies :  No results found.  DISCHARGE INSTRUCTIONS: Discharge Instructions    Call MD / Call 911   Complete by:  As directed    If you experience chest pain or shortness of breath, CALL 911 and be transported to the hospital emergency room.  If you develope a fever above 101 F, pus (white drainage) or increased drainage or redness at the wound, or calf pain, call your surgeon's office.   Constipation Prevention   Complete by:  As directed    Drink plenty of fluids.  Prune juice may be helpful.  You may use a stool softener, such as Colace (over the counter) 100 mg twice a day.  Use MiraLax (over the counter) for constipation as needed.   Diet - low sodium heart healthy   Complete by:  As directed    Discharge instructions   Complete by:  As directed    INSTRUCTIONS AFTER JOINT REPLACEMENT   Remove items at home which could result in a fall.  This includes throw rugs or furniture in walking pathways ICE to the affected joint every three hours while awake for 30 minutes at a time, for at least the first 3-5 days, and then as needed for pain and swelling.  Continue to use ice for pain and swelling. You may notice swelling that will progress down to the foot and ankle.  This is normal after surgery.  Elevate your leg when you are not up walking on it.   Continue to use the breathing machine you got in the hospital (incentive spirometer) which will help keep your temperature down.  It is common for your temperature to cycle up and down following surgery, especially at night when you are not up moving around and exerting yourself.  The breathing machine keeps your lungs expanded and your temperature down.   DIET:  As you were doing prior to hospitalization, we recommend a well-balanced diet.  DRESSING / WOUND CARE / SHOWERING  Keep the surgical dressing until follow up.  The dressing is water proof, so you can shower without any extra covering.  IF THE DRESSING FALLS OFF or the wound gets wet inside, change the dressing with sterile gauze.  Please use good hand washing techniques before changing the dressing.  Do not use any lotions or creams on the incision until instructed by your surgeon.    ACTIVITY  Increase activity slowly as tolerated, but follow the weight bearing instructions below.   No driving for 6 weeks or until further direction given by your physician.  You cannot drive while taking narcotics.  No lifting or carrying greater than 10 lbs. until further directed by your surgeon. Avoid periods of inactivity such as sitting longer than an hour when not asleep. This helps prevent blood clots.  You may return to work once you are authorized by your doctor.     WEIGHT BEARING   Weight bearing as tolerated with assist device (walker, cane, etc) as directed, use it as long as suggested by your surgeon or therapist, typically at  least 4-6 weeks.   EXERCISES  Results after joint replacement surgery are often greatly improved when you follow the exercise, range of motion and muscle strengthening exercises prescribed by your doctor. Safety measures are also important to protect the joint from further injury. Any time any of these exercises cause you to have increased pain or swelling,  decrease what you are doing until you are comfortable again and then slowly increase them. If you have problems or questions, call your caregiver or physical therapist for advice.   Rehabilitation is important following a joint replacement. After just a few days of immobilization, the muscles of the leg can become weakened and shrink (atrophy).  These exercises are designed to build up the tone and strength of the thigh and leg muscles and to improve motion. Often times heat used for twenty to thirty minutes before working out will loosen up your tissues and help with improving the range of motion but do not use heat for the first two weeks following surgery (sometimes heat can increase post-operative swelling).   These exercises can be done on a training (exercise) mat, on the floor, on a table or on a bed. Use whatever works the best and is most comfortable for you.    Use music or television while you are exercising so that the exercises are a pleasant break in your day. This will make your life better with the exercises acting as a break in your routine that you can look forward to.   Perform all exercises about fifteen times, three times per day or as directed.  You should exercise both the operative leg and the other leg as well.   Exercises include:   Quad Sets - Tighten up the muscle on the front of the thigh (Quad) and hold for 5-10 seconds.   Straight Leg Raises - With your knee straight (if you were given a brace, keep it on), lift the leg to 60 degrees, hold for 3 seconds, and slowly lower the leg.  Perform this exercise against  resistance later as your leg gets stronger.  Leg Slides: Lying on your back, slowly slide your foot toward your buttocks, bending your knee up off the floor (only go as far as is comfortable). Then slowly slide your foot back down until your leg is flat on the floor again.  Angel Wings: Lying on your back spread your legs to the side as far apart as you can without causing discomfort.  Hamstring Strength:  Lying on your back, push your heel against the floor with your leg straight by tightening up the muscles of your buttocks.  Repeat, but this time bend your knee to a comfortable angle, and push your heel against the floor.  You may put a pillow under the heel to make it more comfortable if necessary.   A rehabilitation program following joint replacement surgery can speed recovery and prevent re-injury in the future due to weakened muscles. Contact your doctor or a physical therapist for more information on knee rehabilitation.    CONSTIPATION  Constipation is defined medically as fewer than three stools per week and severe constipation as less than one stool per week.  Even if you have a regular bowel pattern at home, your normal regimen is likely to be disrupted due to multiple reasons following surgery.  Combination of anesthesia, postoperative narcotics, change in appetite and fluid intake all can affect your bowels.   YOU MUST use at least one of the following options; they are listed in order of increasing strength to get the job done.  They are all available over the counter, and you may need to use some, POSSIBLY even all of these options:    Drink plenty of fluids (prune juice may be helpful) and high fiber foods Colace 100 mg by mouth twice a day  Senokot for constipation as directed and as needed Dulcolax (bisacodyl), take with full glass of water  Miralax (polyethylene glycol) once or twice a day as needed.  If you have tried all these things and are unable to have a bowel movement  in the first 3-4 days after surgery call either your surgeon or your primary doctor.    If you experience loose stools or diarrhea, hold the medications until you stool forms back up.  If your symptoms do not get better within 1 week or if they get worse, check with your doctor.  If you experience "the worst abdominal pain ever" or develop nausea or vomiting, please contact the office immediately for further recommendations for treatment.   ITCHING:  If you experience itching with your medications, try taking only a single pain pill, or even half a pain pill at a time.  You can also use Benadryl over the counter for itching or also to help with sleep.   TED HOSE STOCKINGS:  Use stockings on both legs until for at least 2 weeks or as directed by physician office. They may be removed at night for sleeping.  MEDICATIONS:  See your medication summary on the "After Visit Summary" that nursing will review with you.  You may have some home medications which will be placed on hold until you complete the course of blood thinner medication.  It is important for you to complete the blood thinner medication as prescribed.  PRECAUTIONS:  If you experience chest pain or shortness of breath - call 911 immediately for transfer to the hospital emergency department.   If you develop a fever greater that 101 F, purulent drainage from wound, increased redness or drainage from wound, foul odor from the wound/dressing, or calf pain - CONTACT YOUR SURGEON.                                                   FOLLOW-UP APPOINTMENTS:  If you do not already have a post-op appointment, please call the office for an appointment to be seen by your surgeon.  Guidelines for how soon to be seen are listed in your "After Visit Summary", but are typically between 1-4 weeks after surgery.  OTHER INSTRUCTIONS:   Knee Replacement:  Do not place pillow under knee, focus on keeping the knee straight while resting. CPM instructions: 0-90  degrees, 2 hours in the morning, 2 hours in the afternoon, and 2 hours in the evening. Place foam block, curve side up under heel at all times except when in CPM or when walking.  DO NOT modify, tear, cut, or change the foam block in any way.  MAKE SURE YOU:  Understand these instructions.  Get help right away if you are not doing well or get worse.    Thank you for letting us be a part of your medical care team.  It is a privilege we respect greatly.  We hope these instructions will help you stay on track for a fast and full recovery!   Increase activity slowly as tolerated   Complete by:  As directed       DISCHARGE MEDICATIONS:   Allergies as of 04/06/2018   No Known Allergies     Medication List    STOP taking these medications   ibuprofen 200 MG tablet Commonly known as:  ADVIL,MOTRIN  TAKE these medications   acetaminophen 500 MG tablet Commonly known as:  TYLENOL Take 2 tablets (1,000 mg total) by mouth every 6 (six) hours.   aspirin 325 MG EC tablet Take 1 tablet (325 mg total) by mouth 2 (two) times daily.   BUFFER CREAM Powd by Does not apply route.   gabapentin 300 MG capsule Commonly known as:  NEURONTIN Take 300 mg by mouth at bedtime.   HUMALOG KWIKPEN 100 UNIT/ML KwikPen Generic drug:  insulin lispro Inject 1-3 Units into the skin 3 (three) times daily. Per sliding scale   liraglutide 18 MG/3ML Sopn Commonly known as:  VICTOZA Inject 18 mg into the skin every morning.   methocarbamol 500 MG tablet Commonly known as:  ROBAXIN Take 1-2 tablets (500-1,000 mg total) by mouth every 6 (six) hours as needed for muscle spasms.   NONFORMULARY OR COMPOUNDED ITEM Apply 1 application topically daily as needed (rash/apply to body folds as needed). 2:1 Triamcinolone to Silvadene   oxyCODONE 5 MG immediate release tablet Commonly known as:  Oxy IR/ROXICODONE Take 1-2 tablets (5-10 mg total) by mouth every 6 (six) hours as needed for moderate pain (pain score  4-6).   TRESIBA 100 UNIT/ML Soln Generic drug:  Insulin Degludec Inject 20 Units into the skin every morning.            Durable Medical Equipment  (From admission, onward)         Start     Ordered   04/04/18 1325  DME Walker rolling  Once    Question:  Patient needs a walker to treat with the following condition  Answer:  S/P total knee replacement   04/04/18 1324   04/04/18 1325  DME 3 n 1  Once     04/04/18 1324   04/04/18 1325  DME Bedside commode  Once    Question:  Patient needs a bedside commode to treat with the following condition  Answer:  S/P total knee replacement   04/04/18 1324          FOLLOW UP VISIT:   Follow-up Information    [facility] Follow up.   Specialty:  Home Health Services Why:  physical therapy Contact information: [address] [place] [address] [place] [address] [phone]           DISPOSITION: HOME VS. SNF  CONDITION:  Meriel Pica 04/06/2018, 7:48 AM

## 2018-04-14 ENCOUNTER — Other Ambulatory Visit: Payer: Self-pay | Admitting: Orthopedic Surgery

## 2018-04-14 DIAGNOSIS — R609 Edema, unspecified: Secondary | ICD-10-CM

## 2018-04-15 ENCOUNTER — Ambulatory Visit
Admission: RE | Admit: 2018-04-15 | Discharge: 2018-04-15 | Disposition: A | Discharge status: Home / Self Care | Payer: Medicare Other | Source: Ambulatory Visit | Attending: Orthopedic Surgery | Admitting: Orthopedic Surgery

## 2018-04-15 ENCOUNTER — Other Ambulatory Visit: Payer: Self-pay | Admitting: Orthopedic Surgery

## 2018-04-15 DIAGNOSIS — R609 Edema, unspecified: Secondary | ICD-10-CM

## 2021-02-19 ENCOUNTER — Emergency Department (HOSPITAL_BASED_OUTPATIENT_CLINIC_OR_DEPARTMENT_OTHER)
Admission: EM | Admit: 2021-02-19 | Discharge: 2021-02-19 | Disposition: A | Discharge status: Home / Self Care | Payer: Medicare Other | Attending: Emergency Medicine | Admitting: Emergency Medicine

## 2021-02-19 ENCOUNTER — Encounter (HOSPITAL_BASED_OUTPATIENT_CLINIC_OR_DEPARTMENT_OTHER): Payer: Self-pay | Admitting: Emergency Medicine

## 2021-02-19 ENCOUNTER — Other Ambulatory Visit: Payer: Self-pay

## 2021-02-19 ENCOUNTER — Emergency Department (HOSPITAL_BASED_OUTPATIENT_CLINIC_OR_DEPARTMENT_OTHER): Payer: Medicare Other

## 2021-02-19 DIAGNOSIS — S3992XA Unspecified injury of lower back, initial encounter: Secondary | ICD-10-CM

## 2021-02-19 DIAGNOSIS — Z794 Long term (current) use of insulin: Secondary | ICD-10-CM | POA: Diagnosis not present

## 2021-02-19 DIAGNOSIS — S8392XA Sprain of unspecified site of left knee, initial encounter: Secondary | ICD-10-CM

## 2021-02-19 DIAGNOSIS — Z7982 Long term (current) use of aspirin: Secondary | ICD-10-CM | POA: Diagnosis not present

## 2021-02-19 DIAGNOSIS — Z96651 Presence of right artificial knee joint: Secondary | ICD-10-CM | POA: Diagnosis not present

## 2021-02-19 DIAGNOSIS — M25572 Pain in left ankle and joints of left foot: Secondary | ICD-10-CM | POA: Diagnosis not present

## 2021-02-19 DIAGNOSIS — W19XXXA Unspecified fall, initial encounter: Secondary | ICD-10-CM

## 2021-02-19 DIAGNOSIS — W109XXA Fall (on) (from) unspecified stairs and steps, initial encounter: Secondary | ICD-10-CM | POA: Insufficient documentation

## 2021-02-19 DIAGNOSIS — E119 Type 2 diabetes mellitus without complications: Secondary | ICD-10-CM | POA: Diagnosis not present

## 2021-02-19 DIAGNOSIS — M25562 Pain in left knee: Secondary | ICD-10-CM | POA: Diagnosis present

## 2021-02-19 NOTE — ED Triage Notes (Addendum)
Fell down 3 steps about noon, hit her left knee, bent backwarks , left ankle and landed on her tail bone and hit the back of her head, no LOC, denies blood thinners , has new fistula left upper arm

## 2021-02-19 NOTE — ED Provider Notes (Signed)
[place] EMERGENCY DEPT Provider Note   CSN: [id number] Arrival date & time: 02/19/21  1510     History Chief Complaint  Patient presents with   Michele Frost is a 70 y.o. female.  Patient with history of right knee replacement, diabetes, CKD with recent fistula placement --presents to the emergency department today for evaluation of injury sustained during a fall occurring prior to arrival.  Patient states that she was going down stairs and lost her footing.  She was holding onto the railing because she cannot navigate stairs well due to her previous knee replacement.  She bent her left knee awkwardly and twisted.  She also twisted her left ankle.  She then fell backwards and landed on her tailbone area.  Currently her worst pain is in the knee and tailbone.  She is able to ambulate but with difficulty due to pain.  No treatments prior to arrival.  She did hit the back of her head when she fell and denies loss of consciousness.  Currently she has no pain in her head or neck.  No vomiting or confusion since the fall.  She is not anticoagulated.      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     No family history on file.  Social History   Tobacco Use   Smoking status: Never   Smokeless tobacco: Never  Vaping Use   Vaping Use: Never used  Substance Use Topics   Alcohol  use: No   Drug use: No    Home Medications Prior to Admission medications   Medication Sig Start Date End Date Taking? Authorizing Provider  acetaminophen (TYLENOL) 500 MG tablet Take 2 tablets (1,000 mg total) by mouth every 6 (six) hours. 04/05/18   Donia Ast, PA  aspirin EC 325 MG EC tablet Take 1 tablet (325 mg total) by mouth 2 (two) times daily. 04/05/18   Donia Ast, PA  gabapentin (NEURONTIN) 300 MG capsule Take 300 mg by mouth at bedtime.    [provider]  Insulin Degludec (TRESIBA) 100 UNIT/ML SOLN Inject 20 Units into the skin every morning.    [provider]  insulin lispro (HUMALOG KWIKPEN) 100 UNIT/ML KwikPen Inject 1-3 Units into the skin 3 (three) times daily. Per sliding scale 11/30/17   [provider]  liraglutide (VICTOZA) 18 MG/3ML SOPN Inject 18 mg into the skin every morning.    [provider]  methocarbamol (ROBAXIN) 500 MG tablet Take 1-2 tablets (500-1,000 mg total) by mouth every 6 (six) hours as needed for muscle spasms. 04/05/18   Donia Ast, PA  NONFORMULARY OR COMPOUNDED ITEM Apply 1 application topically daily as needed (rash/apply to body folds as needed). 2:1 Triamcinolone to Silvadene    [provider]  oxyCODONE (OXY IR/ROXICODONE) 5 MG immediate release tablet Take 1-2 tablets (5-10 mg total) by mouth every 6 (six) hours as needed for moderate pain (pain score 4-6). 04/05/18   Donia Ast, PA  Pharmaceutical Excipients (BUFFER CREAM) POWD by Does not apply route.    [provider]    Allergies    Patient has no known allergies.  Review of Systems   Review of Systems  Constitutional:  Negative for fatigue.  HENT:  Negative for tinnitus.   Eyes:  Negative for photophobia, pain and visual disturbance.  Respiratory:  Negative for shortness of breath.   Cardiovascular:  Negative for chest pain.  Gastrointestinal:  Negative for nausea and vomiting.  Musculoskeletal:   Positive for arthralgias, back pain, gait problem and myalgias. Negative for neck pain.  Skin:  Negative for wound.  Neurological:  Negative for dizziness, weakness, light-headedness, numbness and headaches.  Psychiatric/Behavioral:  Negative for confusion and decreased concentration.    Physical Exam Updated Vital Signs BP 139/77 (BP Location: Right Arm)   Pulse 82   Temp 97.9 F (36.6 C)   Resp 17   SpO2 100%   Physical Exam Vitals and nursing note reviewed.  Constitutional:      Appearance: She is well-developed.  HENT:     Head: Normocephalic and atraumatic. No raccoon eyes or Battle's sign.     Right Ear: Tympanic membrane, ear canal and external ear normal. No hemotympanum.     Left Ear: Tympanic membrane, ear canal and external ear normal. No hemotympanum.     Nose: Nose normal.     Mouth/Throat:     Pharynx: Uvula midline.  Eyes:     General: Lids are normal.     Extraocular Movements:     Right eye: No nystagmus.     Left eye: No nystagmus.     Conjunctiva/sclera: Conjunctivae normal.     Pupils: Pupils are equal, round, and reactive to light.     Comments: No visible hyphema noted  Cardiovascular:     Rate and Rhythm: Normal rate and regular rhythm.  Pulmonary:     Effort: Pulmonary effort is normal.     Breath sounds: Normal breath sounds.  Abdominal:     Palpations: Abdomen is soft.     Tenderness: There is no abdominal tenderness.  Musculoskeletal:     Cervical back: Normal range of motion and neck supple. No tenderness or bony tenderness.     Thoracic back: No tenderness or bony tenderness.     Lumbar back: No tenderness or bony tenderness.     Right hip: No tenderness. Normal range of motion.     Left hip: No tenderness. Normal range of motion.     Left knee: No swelling or effusion. Normal range of motion. Tenderness present over the medial joint line.     Left lower leg: No swelling or tenderness.     Left ankle: Tenderness present over the lateral  malleolus and medial malleolus. Normal range of motion.     Left foot: Normal range of motion. No tenderness or bony tenderness.  Skin:    General: Skin is warm and dry.  Neurological:     Mental Status: She is alert and oriented to person, place, and time.     GCS: GCS eye subscore is 4. GCS verbal subscore is 5. GCS motor subscore is 6.     Cranial Nerves: No cranial nerve deficit.     Sensory: No sensory  deficit.     Coordination: Coordination normal.     Deep Tendon Reflexes: Reflexes are normal and symmetric.    ED Results / Procedures / Treatments   Labs (all labs ordered are listed, but only abnormal results are displayed) Labs Reviewed - No data to display  EKG None  Radiology No results found.  Procedures Procedures   Medications Ordered in ED Medications - No data to display  ED Course  I have reviewed the triage vital signs and the nursing notes.  Pertinent labs & imaging results that were available during my care of the patient were reviewed by me and considered in my medical decision making (see chart for details).  Patient seen and examined. Work-up initiated. Medication offered, patient declines.  At this point she has no head or neck pain and a normal exam.  We discussed head imaging.  Given that she is asymptomatic, patient is in agreement to closely monitor her symptoms and defer imaging at this time.  Vital signs reviewed and are as follows: BP 139/77 (BP Location: Right Arm)   Pulse 82   Temp 97.9 F (36.6 C)   Resp 17   SpO2 100%   4:47 PM patient stable from a head injury standpoint.  Reviewed negative x-ray results.  Offered Ace wrap, patient declines stating that she has had poor luck with these in the past staying in place.   She would like to try to avoid taking any medication due to her chronic kidney disease, but does have extra strength Tylenol to use if needed.  Will ambulate and plan for discharged home with continued rice protocol, PCP  follow-up as needed.    MDM Rules/Calculators/A&P                           Patient with injuries sustained from a mechanical fall on stairs today.  Suspect minor head injury but patient is asymptomatic without significant risk factors.  No neurologic decompensation during ED stay.  Plain films of the affected areas were negative.  Will treat symptomatically.  Final Clinical Impression(s) / ED Diagnoses Final diagnoses:  Injury of coccyx, initial encounter  Sprain of left knee, unspecified ligament, initial encounter  Fall, initial encounter  Acute left ankle pain    Rx / DC Orders ED Discharge Orders     None        Renne Crigler, Cordelia Poche 02/19/21 1648    Cheryll Cockayne, MD 02/19/21 2310

## 2021-02-19 NOTE — ED Notes (Signed)
Pt ambulated in the room. Pt walks "gingerly", due to pain. Pt stated that she "does not have any problem walking".

## 2021-02-19 NOTE — Discharge Instructions (Signed)
Please read and follow all provided instructions.  Your diagnoses today include:  1. Injury of coccyx, initial encounter   2. Sprain of left knee, unspecified ligament, initial encounter   3. Fall, initial encounter   4. Acute left ankle pain     Tests performed today include: X-ray of your tailbone, knee, ankle: Fortunately no broken bones seen today Vital signs. See below for your results today.   Medications prescribed:  None  Take any prescribed medications only as directed.  Home care instructions:  Follow any educational materials contained in this packet.  BE VERY CAREFUL not to take multiple medicines containing Tylenol (also called acetaminophen). Doing so can lead to an overdose which can damage your liver and cause liver failure and possibly death.   Follow-up instructions: Please follow-up with your primary care provider as needed for further evaluation of your symptoms.   Return instructions:  Please return to the Emergency Department if you experience worsening symptoms.  Please return if you have any other emergent concerns.  Additional Information:  Your vital signs today were: BP 139/77 (BP Location: Right Arm)   Pulse 82   Temp 97.9 F (36.6 C)   Resp 17   SpO2 100%  If your blood pressure (BP) was elevated above 135/85 this visit, please have this repeated by your doctor within one month. --------------

## 2022-10-14 IMAGING — DX DG SACRUM/COCCYX 2+V
2 series · 3 of 3 positions shown · non-contrast
Comparison: None.

CLINICAL DATA: Fall, tailbone pain

EXAM:
SACRUM AND COCCYX - 2+ VIEW

[coccyx]
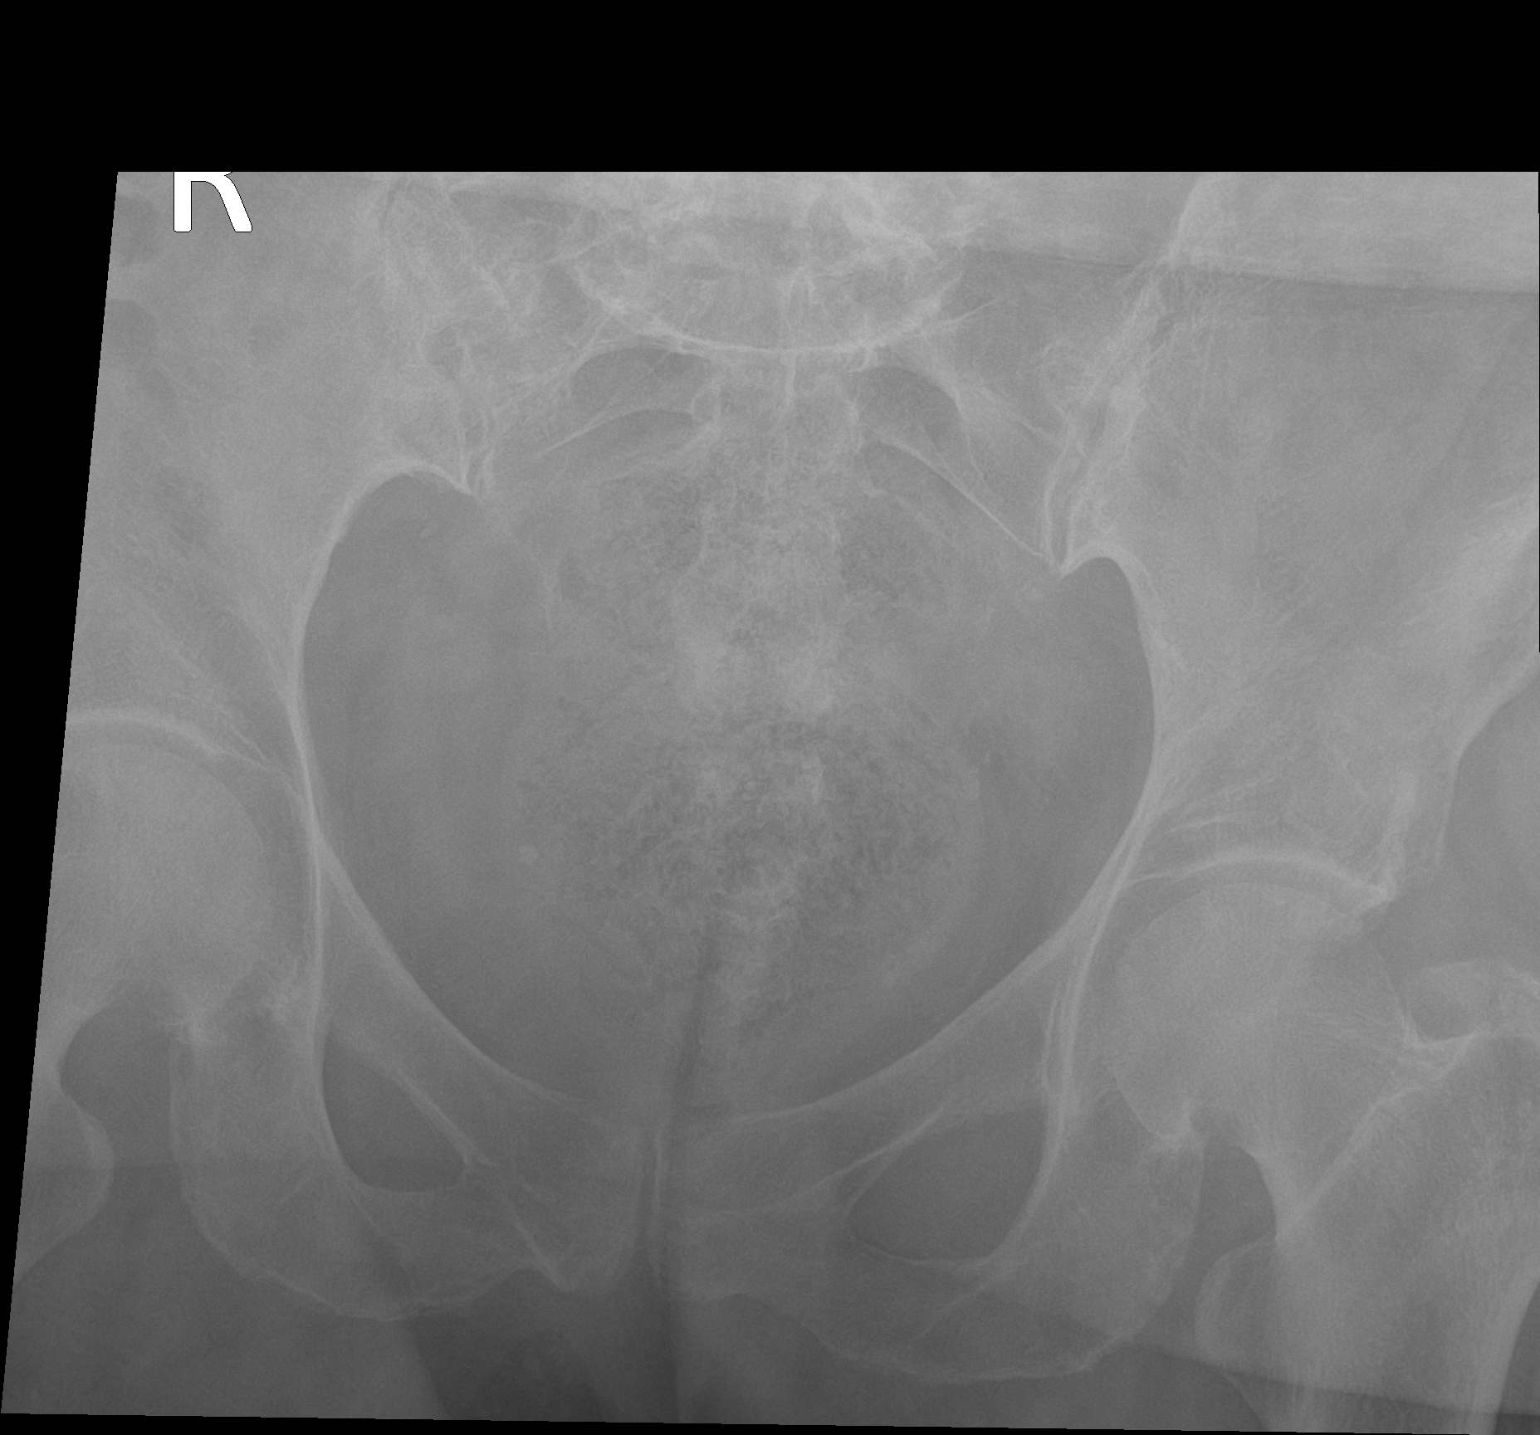

[Series 2: sspine · 0.14mm/px · 2 of 2 slices shown]
[im 1/2]
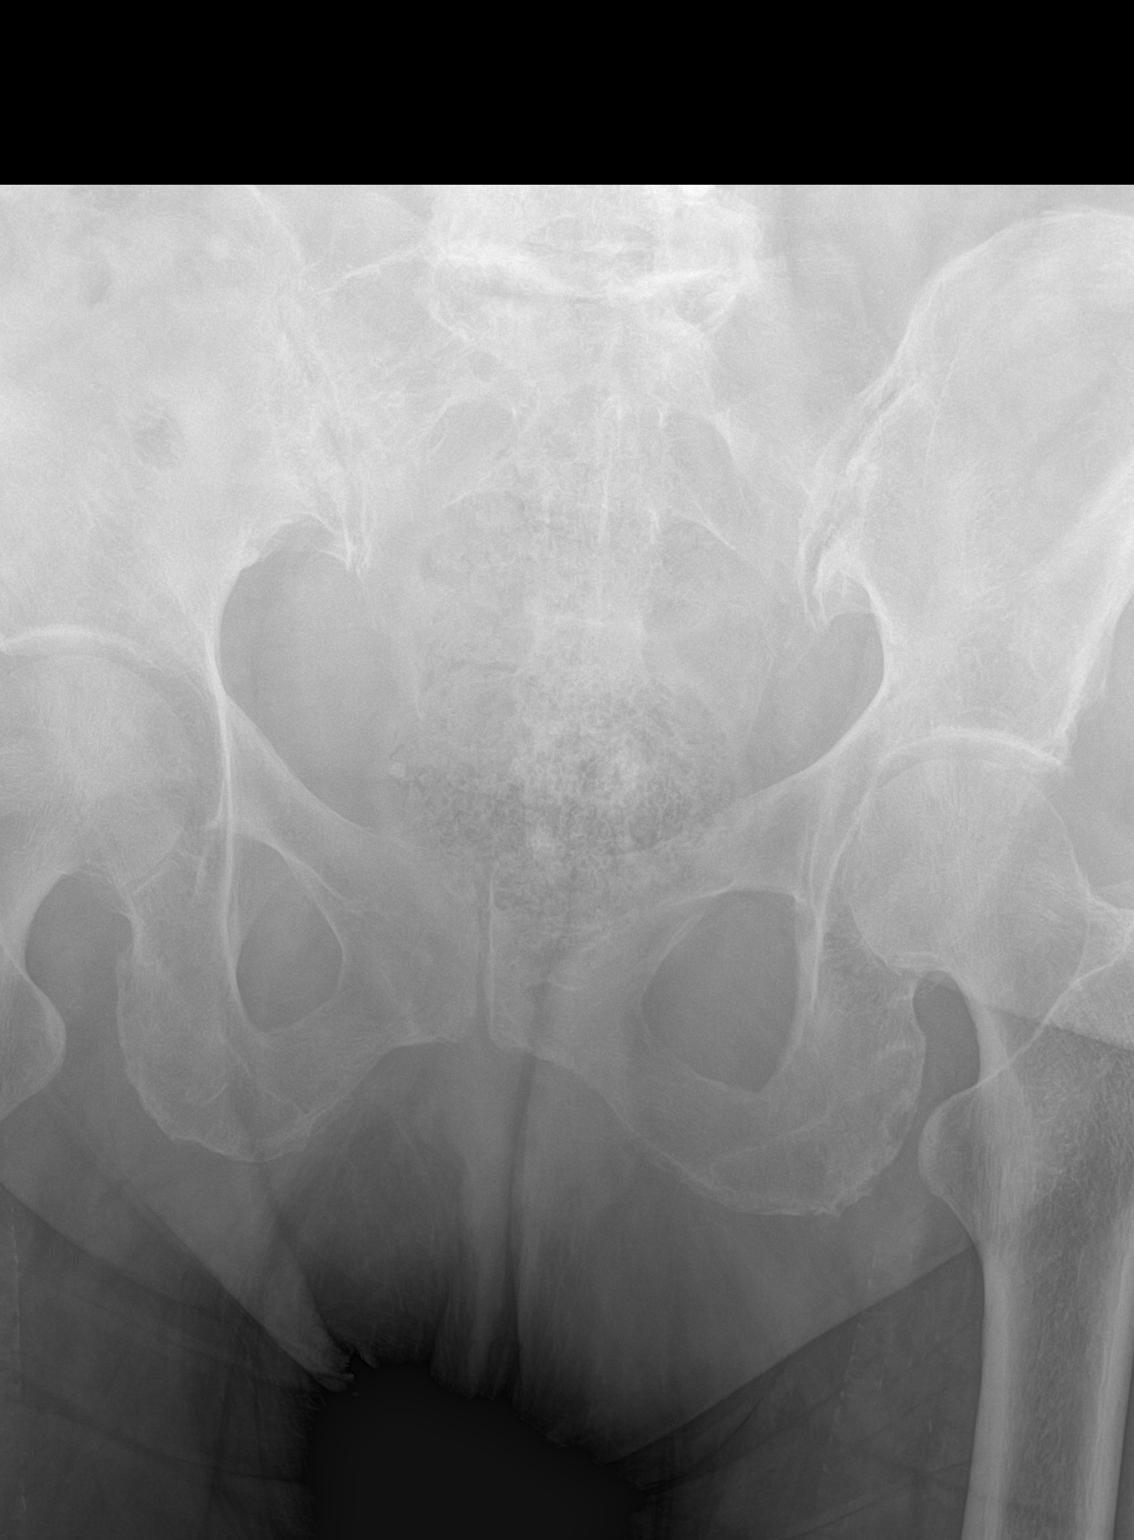
[im 2/2]
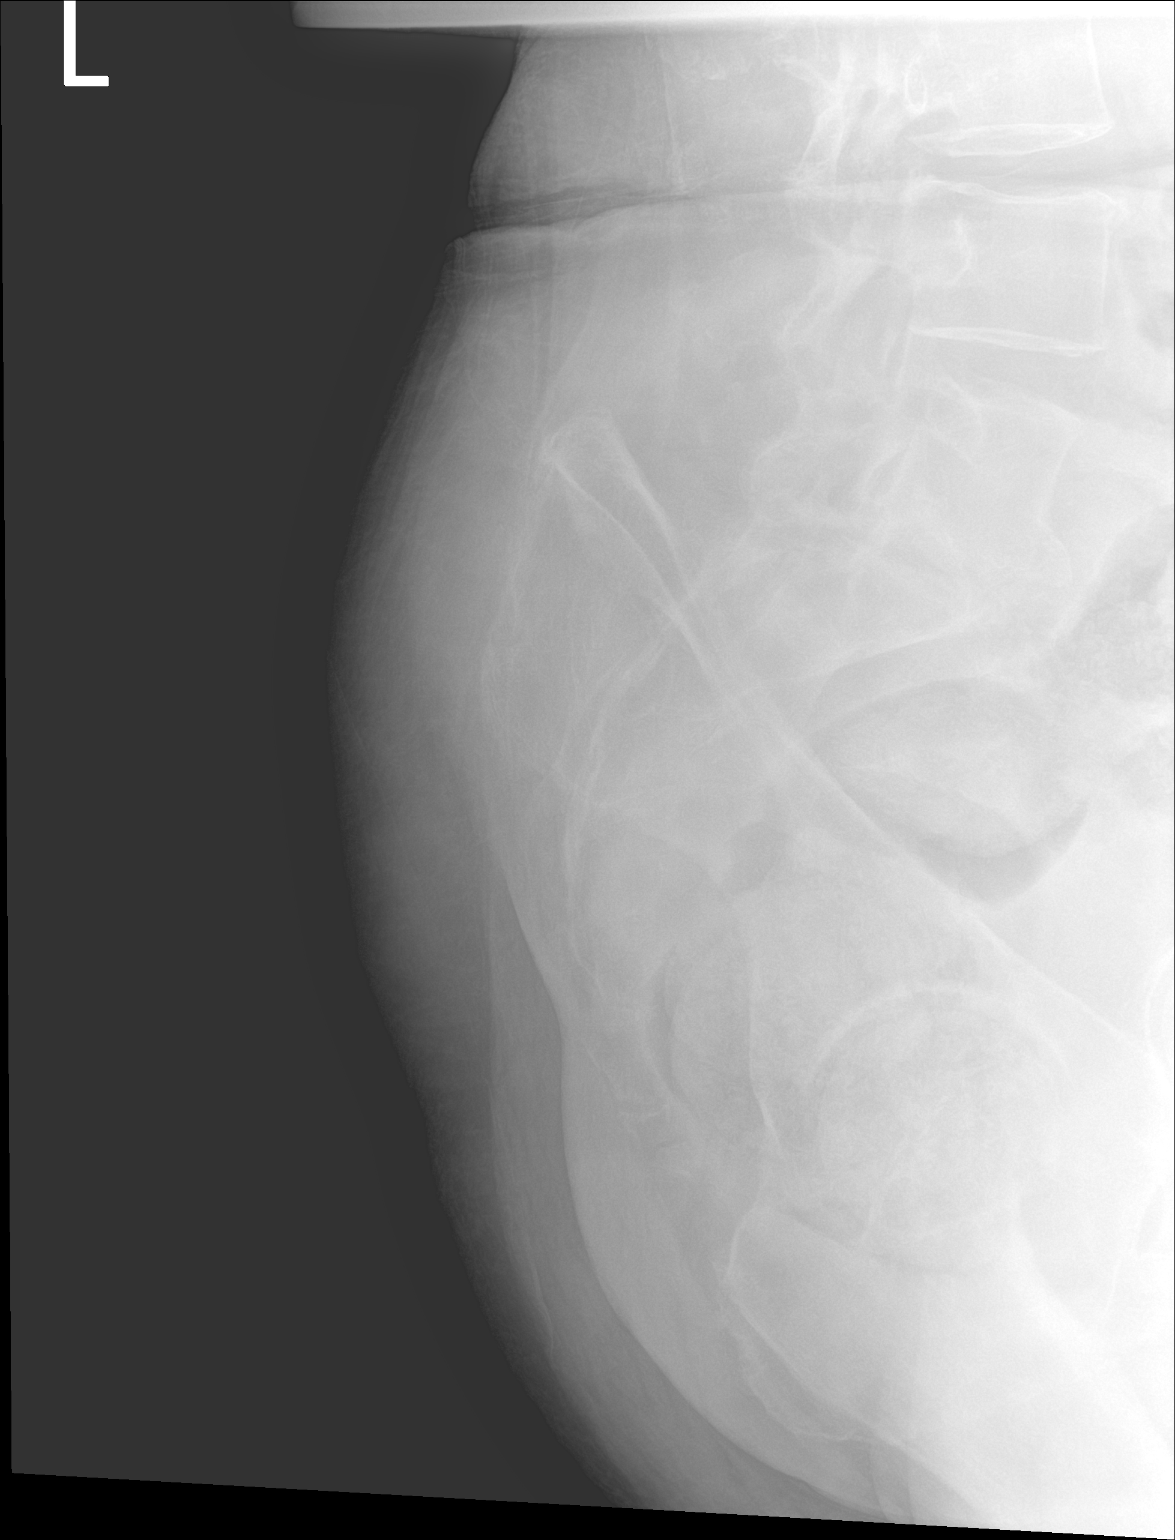

[3 of 3 positions shown; findings below may reference images not displayed]

FINDINGS: No visible fracture. SI joints and hip joints symmetric and
unremarkable.
IMPRESSION: No acute bony abnormality.

## 2022-10-14 IMAGING — DX DG ANKLE COMPLETE 3+V*L*
1 series · 3 of 3 positions shown · non-contrast
Comparison: None.

CLINICAL DATA: Fall, left ankle pain

EXAM:
LEFT ANKLE COMPLETE - 3+ VIEW

[Series 1: ankle · 0.14mm/px · 3 of 3 slices shown]
[im 1/3]
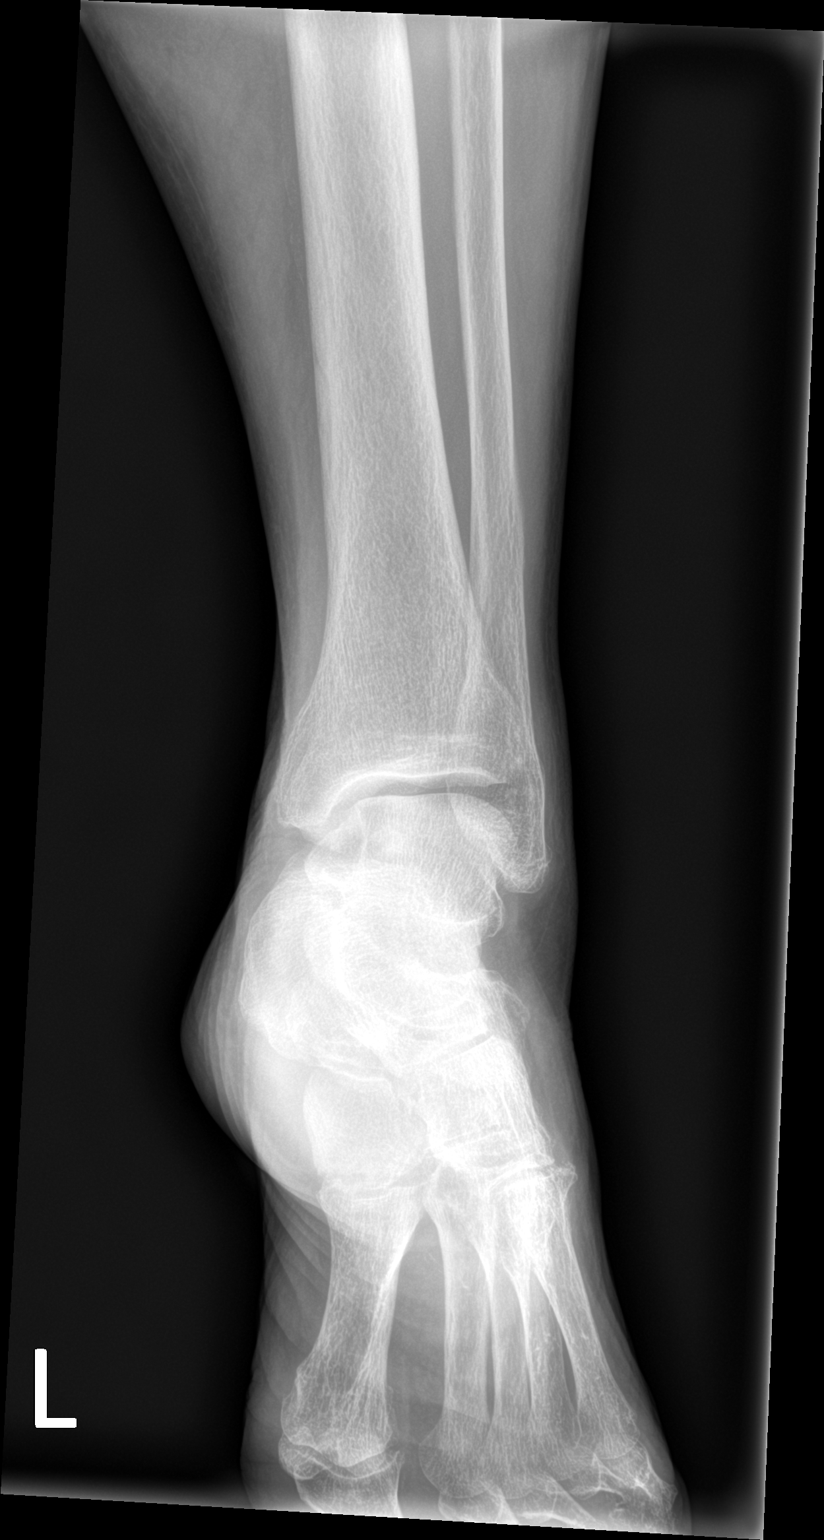
[im 2/3]
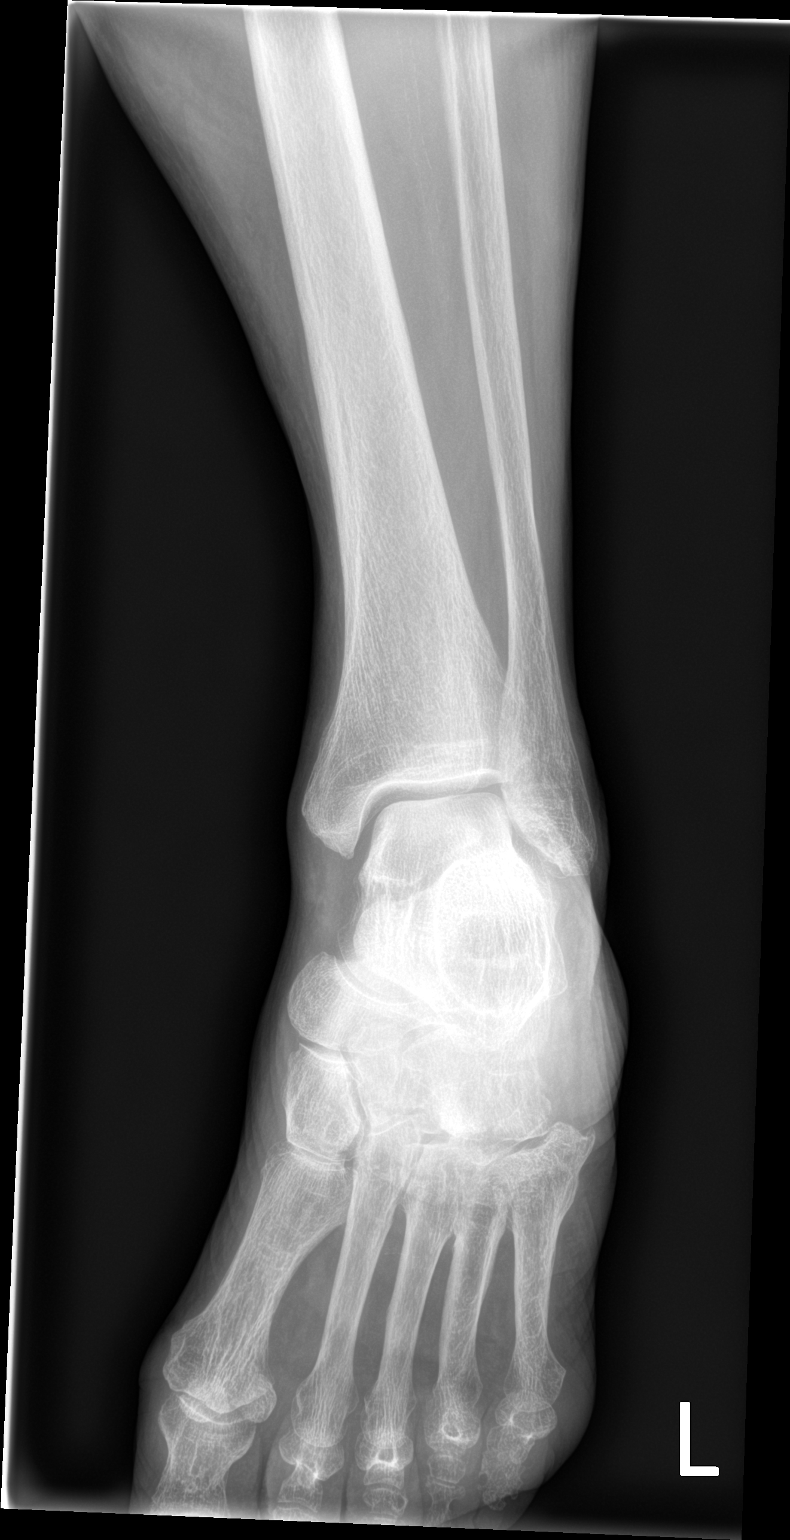
[im 3/3]
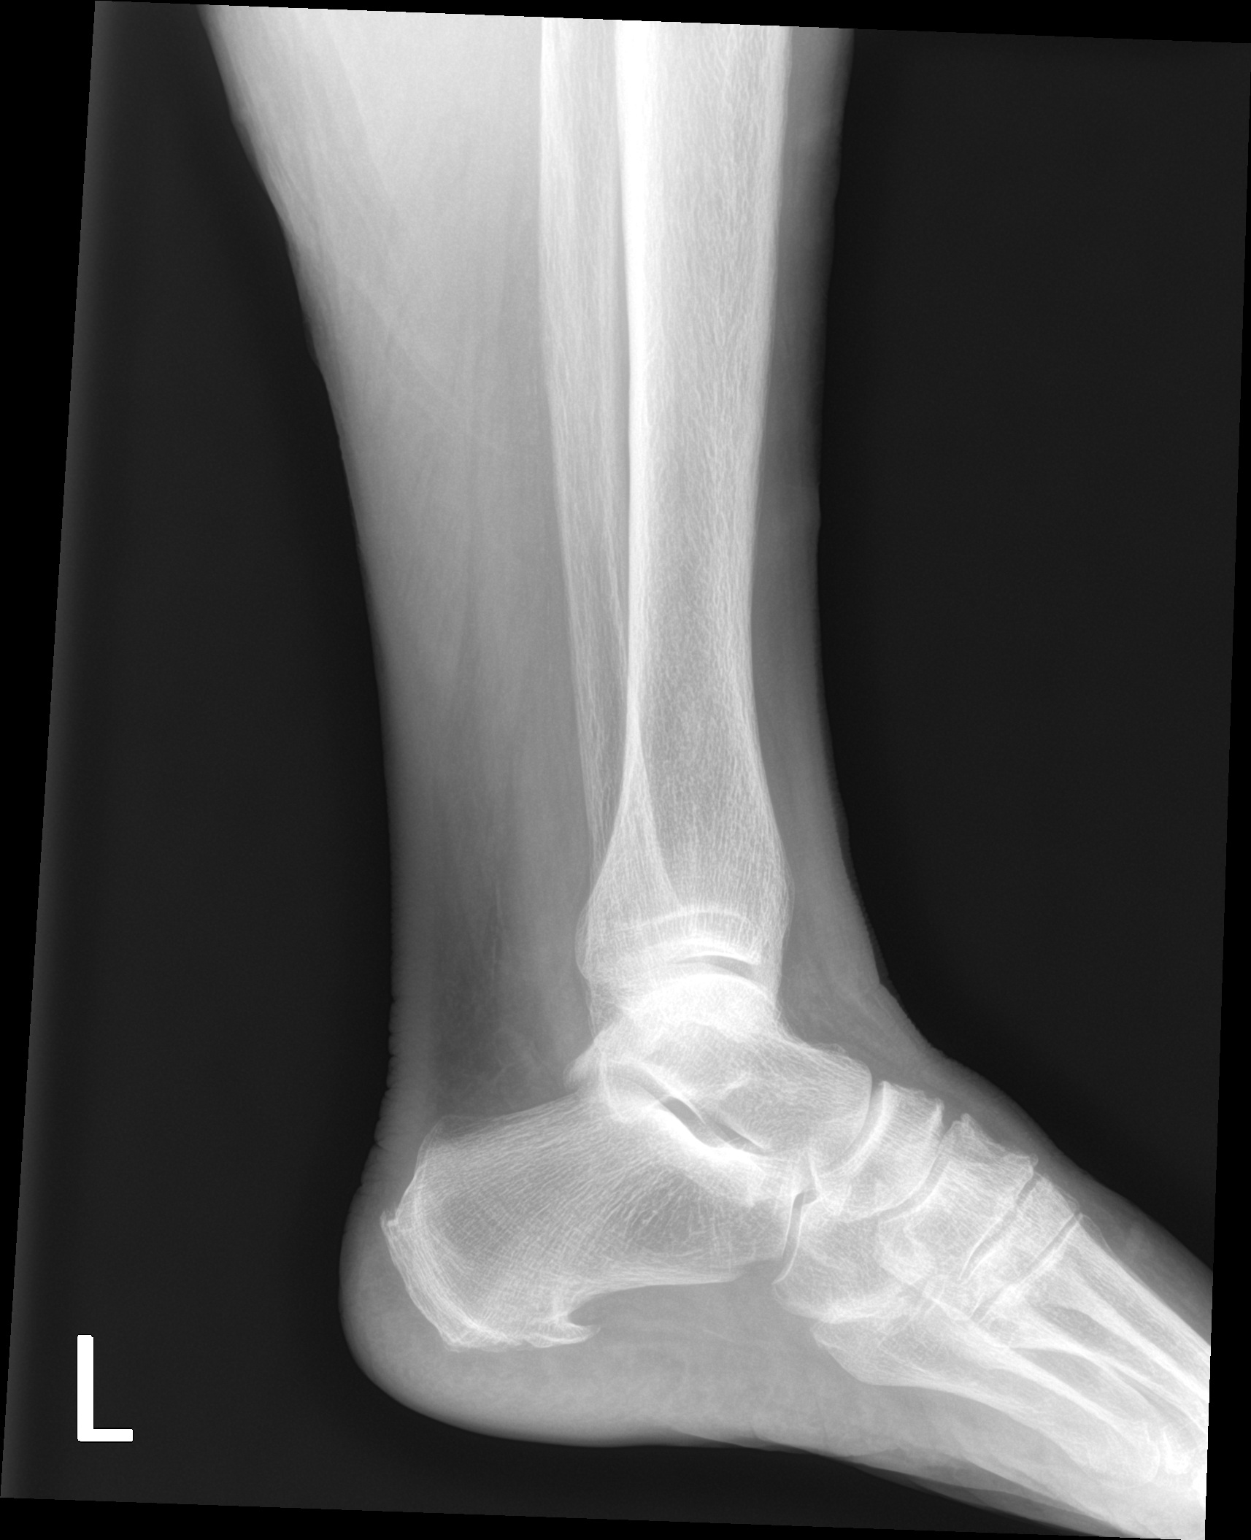

[3 of 3 positions shown; findings below may reference images not displayed]

FINDINGS: Plantar calcaneal spur. No acute bony abnormality. Specifically, no
fracture, subluxation, or dislocation.
IMPRESSION: No acute bony abnormality.

## 2022-10-14 IMAGING — DX DG KNEE COMPLETE 4+V*L*
1 series · 4 of 4 positions shown · non-contrast
Comparison: None.

CLINICAL DATA: Fall, left knee pain

EXAM:
LEFT KNEE - COMPLETE 4+ VIEW

[Series 1: knee · 0.14mm/px · 4 of 4 slices shown]
[im 1/4]
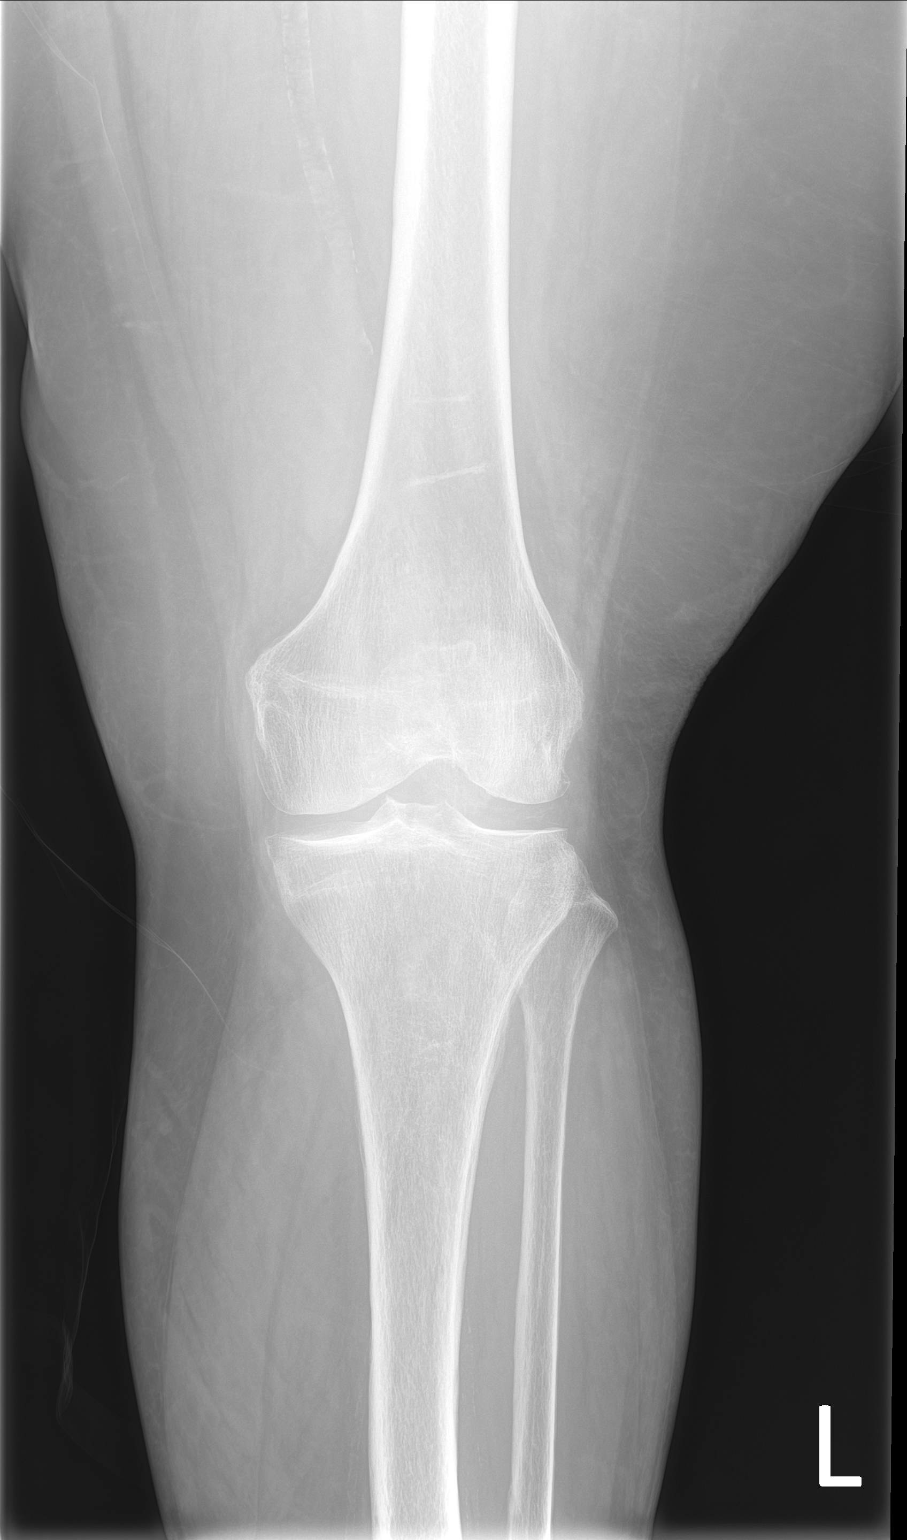
[im 2/4]
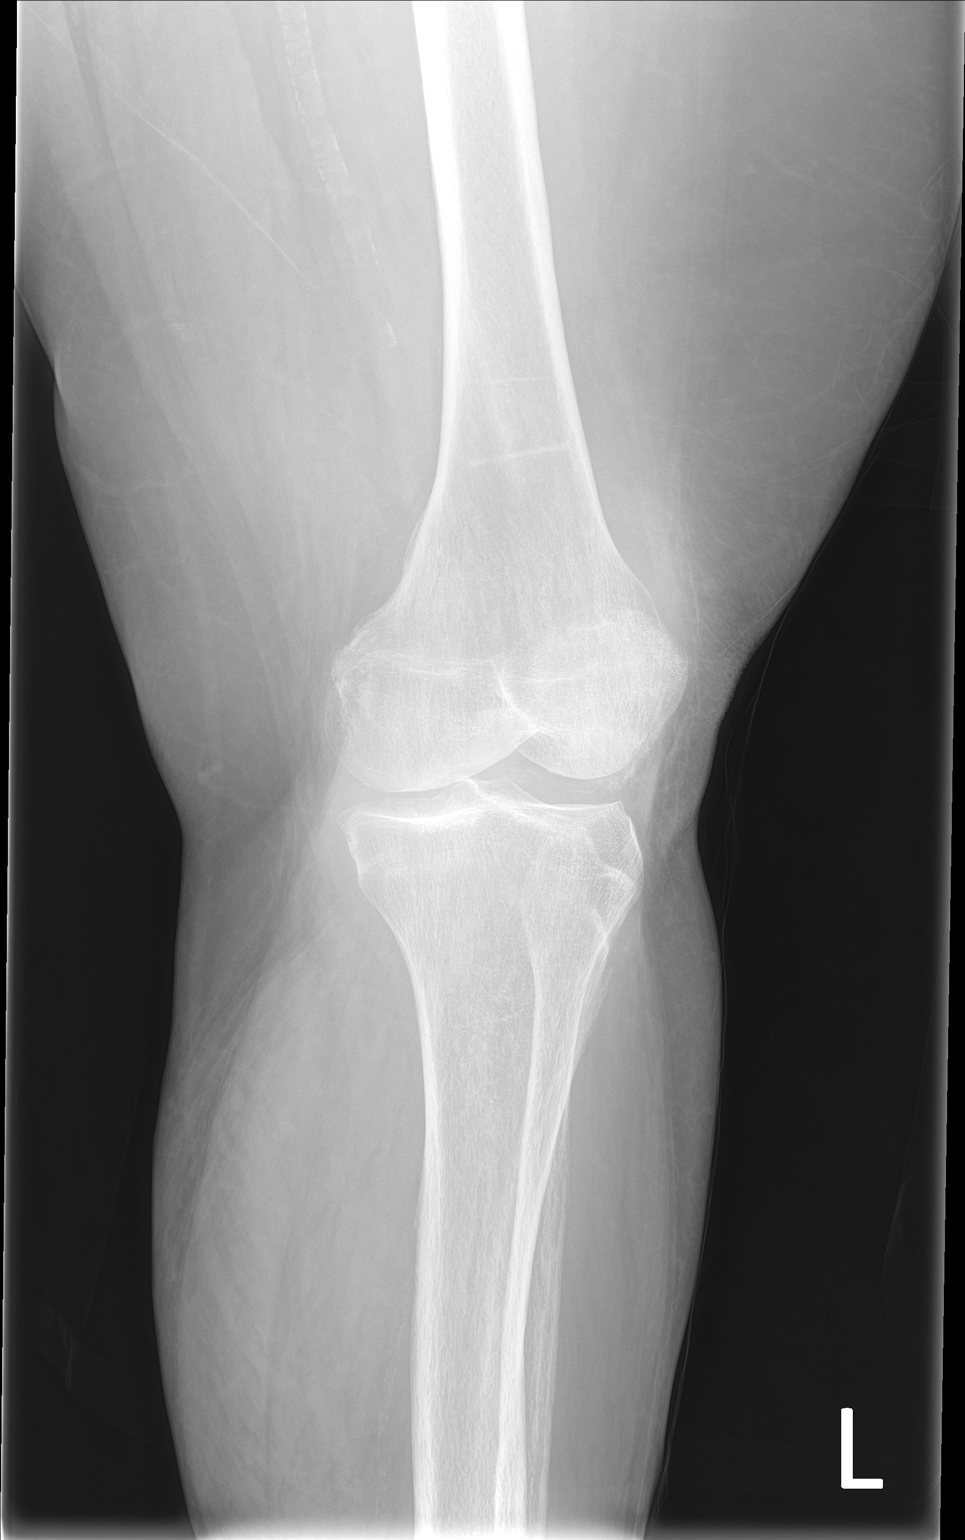
[im 3/4]
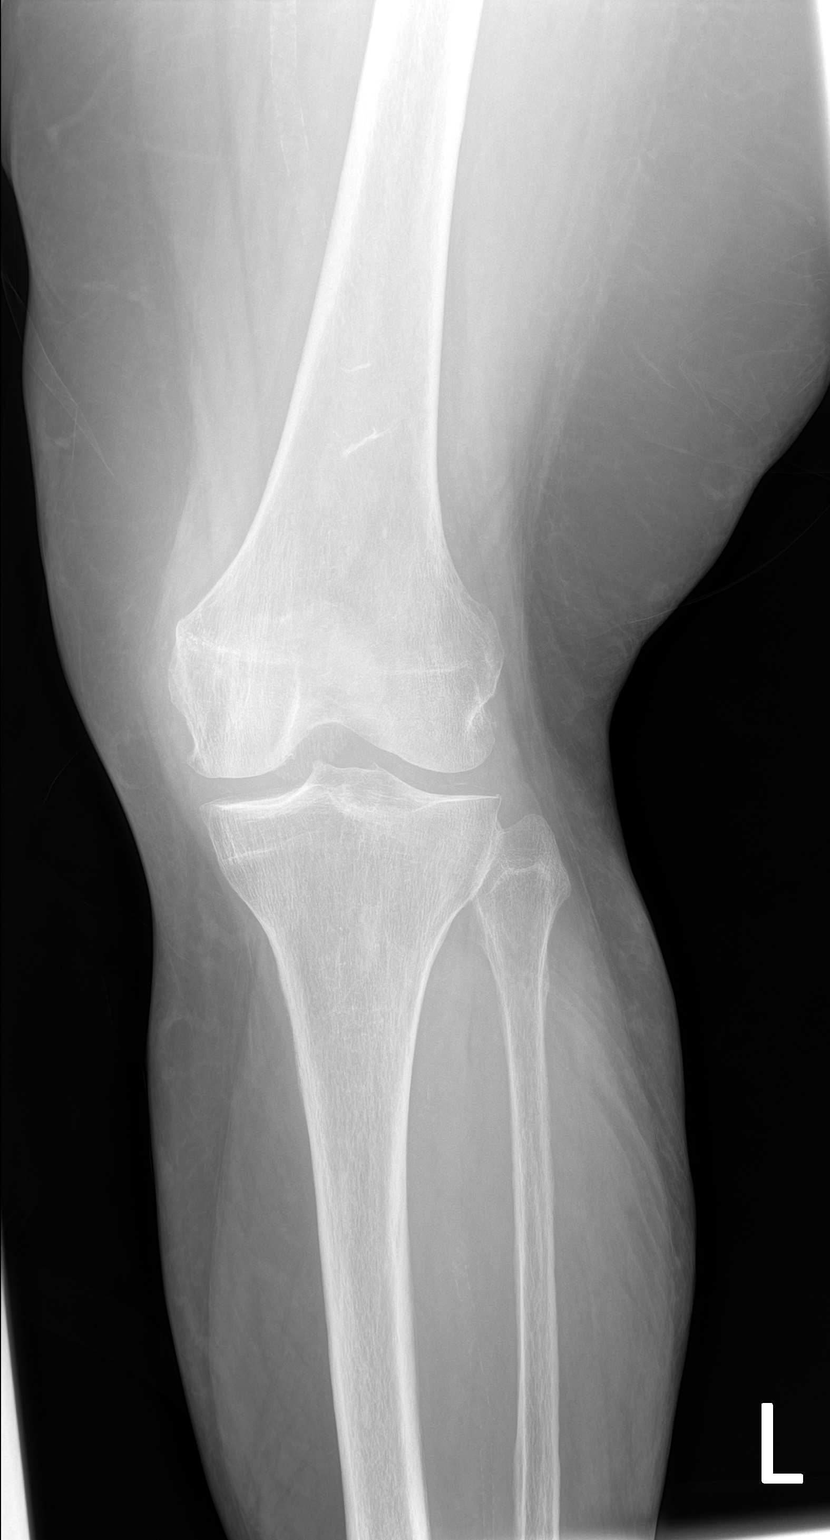
[im 4/4]
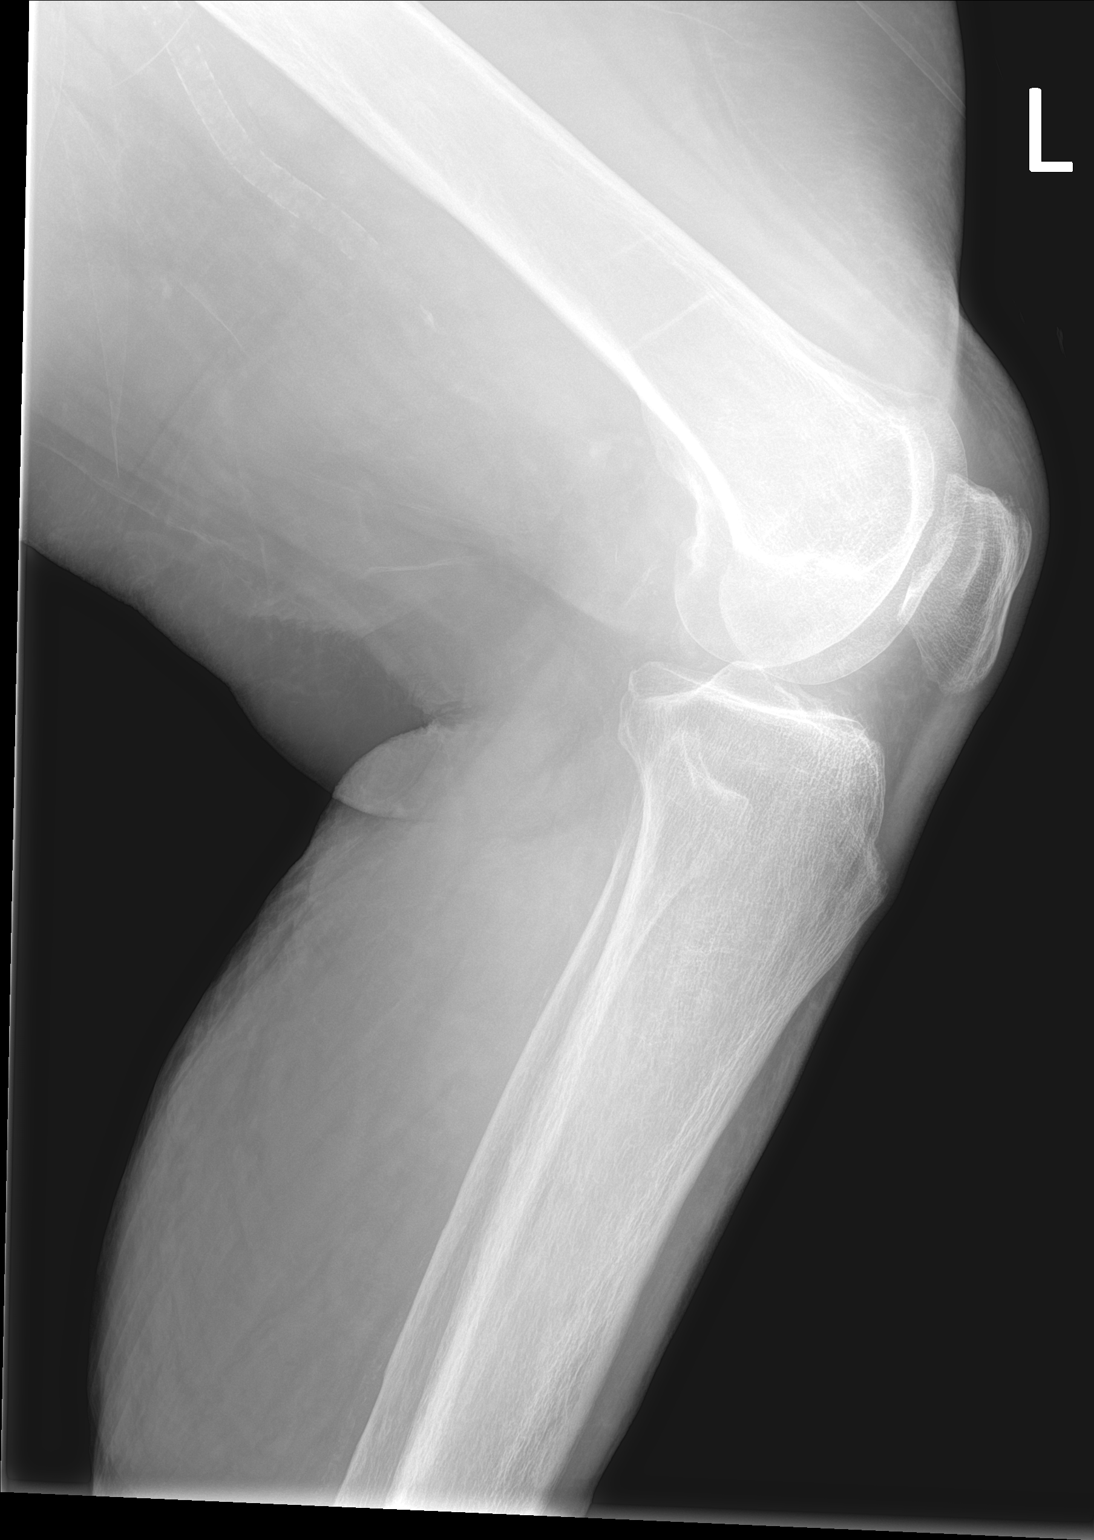

[4 of 4 positions shown; findings below may reference images not displayed]

FINDINGS: Mild joint space narrowing in the patellofemoral compartment. No
acute bony abnormality. Specifically, no fracture, subluxation, or
dislocation. No joint effusion.
IMPRESSION: No acute bony abnormality.  Early degenerative changes.
